# Patient Record
Sex: Female | Born: 1985 | Race: White | Hispanic: Yes | Marital: Married | Smoking: Never smoker
Health system: Southern US, Community
[De-identification: ages and names within clinical notes are randomized; demographics above are authoritative.]

## PROBLEM LIST (undated history)

## (undated) DIAGNOSIS — A630 Anogenital (venereal) warts: Secondary | ICD-10-CM

## (undated) DIAGNOSIS — R519 Headache, unspecified: Secondary | ICD-10-CM

## (undated) DIAGNOSIS — N83209 Unspecified ovarian cyst, unspecified side: Secondary | ICD-10-CM

## (undated) DIAGNOSIS — D219 Benign neoplasm of connective and other soft tissue, unspecified: Secondary | ICD-10-CM

## (undated) DIAGNOSIS — N390 Urinary tract infection, site not specified: Secondary | ICD-10-CM

## (undated) HISTORY — DX: Anogenital (venereal) warts: A63.0

## (undated) HISTORY — DX: Unspecified ovarian cyst, unspecified side: N83.209

## (undated) HISTORY — PX: NO PAST SURGERIES: SHX2092

---

## 2017-09-04 DIAGNOSIS — G43909 Migraine, unspecified, not intractable, without status migrainosus: Secondary | ICD-10-CM | POA: Insufficient documentation

## 2018-01-25 DIAGNOSIS — R7989 Other specified abnormal findings of blood chemistry: Secondary | ICD-10-CM | POA: Insufficient documentation

## 2019-07-29 DIAGNOSIS — A630 Anogenital (venereal) warts: Secondary | ICD-10-CM | POA: Insufficient documentation

## 2019-08-05 LAB — OB RESULTS CONSOLE GC/CHLAMYDIA
Chlamydia: NEGATIVE
Gonorrhea: NEGATIVE

## 2019-08-05 LAB — OB RESULTS CONSOLE ABO/RH: RH Type: POSITIVE

## 2019-08-05 LAB — OB RESULTS CONSOLE RUBELLA ANTIBODY, IGM: Rubella: IMMUNE

## 2019-08-05 LAB — OB RESULTS CONSOLE HIV ANTIBODY (ROUTINE TESTING): HIV: NONREACTIVE

## 2019-08-05 LAB — OB RESULTS CONSOLE HEPATITIS B SURFACE ANTIGEN: Hepatitis B Surface Ag: NEGATIVE

## 2019-08-05 LAB — OB RESULTS CONSOLE RPR: RPR: NONREACTIVE

## 2019-08-05 LAB — OB RESULTS CONSOLE ANTIBODY SCREEN: Antibody Screen: NEGATIVE

## 2019-08-10 ENCOUNTER — Encounter (HOSPITAL_COMMUNITY): Payer: Self-pay

## 2019-08-10 ENCOUNTER — Inpatient Hospital Stay (HOSPITAL_COMMUNITY)
Admission: EM | Admit: 2019-08-10 | Discharge: 2019-08-10 | Disposition: A | Discharge status: Home / Self Care | Payer: BC Managed Care – PPO | Attending: Obstetrics and Gynecology | Admitting: Obstetrics and Gynecology

## 2019-08-10 ENCOUNTER — Other Ambulatory Visit: Payer: Self-pay

## 2019-08-10 DIAGNOSIS — Z3A08 8 weeks gestation of pregnancy: Secondary | ICD-10-CM | POA: Diagnosis not present

## 2019-08-10 DIAGNOSIS — K219 Gastro-esophageal reflux disease without esophagitis: Secondary | ICD-10-CM

## 2019-08-10 DIAGNOSIS — O99281 Endocrine, nutritional and metabolic diseases complicating pregnancy, first trimester: Secondary | ICD-10-CM | POA: Diagnosis not present

## 2019-08-10 DIAGNOSIS — O219 Vomiting of pregnancy, unspecified: Secondary | ICD-10-CM | POA: Insufficient documentation

## 2019-08-10 DIAGNOSIS — E860 Dehydration: Secondary | ICD-10-CM | POA: Insufficient documentation

## 2019-08-10 HISTORY — DX: Headache, unspecified: R51.9

## 2019-08-10 LAB — URINALYSIS, ROUTINE W REFLEX MICROSCOPIC
Bilirubin Urine: NEGATIVE
Glucose, UA: NEGATIVE mg/dL
Hgb urine dipstick: NEGATIVE
Ketones, ur: 20 mg/dL — AB
Leukocytes,Ua: NEGATIVE
Nitrite: NEGATIVE
Protein, ur: NEGATIVE mg/dL
Specific Gravity, Urine: 1.017 (ref 1.005–1.030)
pH: 7 (ref 5.0–8.0)

## 2019-08-10 MED ORDER — LACTATED RINGERS IV BOLUS
1000.0000 mL | Freq: Once | INTRAVENOUS | Status: AC
  Administered 2019-08-10: 1000 mL via INTRAVENOUS

## 2019-08-10 MED ORDER — METOCLOPRAMIDE HCL 10 MG PO TABS: 10.0000 mg | ORAL_TABLET | Freq: Three times a day (TID) | ORAL | 1 refills | Status: DC

## 2019-08-10 MED ORDER — FAMOTIDINE 20 MG PO TABS: 20.0000 mg | ORAL_TABLET | Freq: Two times a day (BID) | ORAL | 1 refills | Status: DC

## 2019-08-10 MED ORDER — SODIUM CHLORIDE 0.9 % IV SOLN
25.0000 mg | Freq: Once | INTRAVENOUS | Status: AC
  Administered 2019-08-10: 25 mg via INTRAVENOUS
  Filled 2019-08-10: qty 1

## 2019-08-10 MED ORDER — FAMOTIDINE IN NACL 20-0.9 MG/50ML-% IV SOLN
20.0000 mg | Freq: Once | INTRAVENOUS | Status: AC
  Administered 2019-08-10: 20 mg via INTRAVENOUS
  Filled 2019-08-10: qty 50

## 2019-08-10 NOTE — Discharge Instructions (Signed)

## 2019-08-10 NOTE — MAU Note (Addendum)
States she has been having ongoing N/V.  Was prescribed diclegis without relief so they told her to take unisom/vitamin b6 but only takes it at night because it makes her tired.  She feels like she's vomiting every 2 hours.  Today started having cramps and feeling weak.  No VB.  LMP 06/12/2019.  States she had an u/s in the office at 7 weeks and had an IUP w/ a heartbeat.

## 2019-08-10 NOTE — MAU Note (Signed)
Patient provided evidence of her patient portal results for pregnancy verification and ultrasound video sent directly from the [facility] office.  Episode recorded.

## 2019-08-10 NOTE — MAU Provider Note (Addendum)
History     CSN: [id number]  Arrival date and time: 08/10/19 [id number]   First Provider Initiated Contact with Patient 08/10/19 2015      Chief Complaint  Patient presents with  . Nausea  . Emesis   HPI   Ms.Destiny Keith is a 33 y.o. female G1P0 @ [redacted]w[redacted]d here in MAU with complaints of nausea and vomiting. Says she has been taking diclegis at night and B6 during the day. She works as a Pharmacist, hospital and cannot take anything during the day that would make her sleepy. Today she vomited 5x. States she has lost 2-3 lbs. She tried eating some soup and vomited. She feels burning at the top of her abdomen. No vaginal bleeding. Patient had [place] in the office that showed fetus with heartbeat.   OB History    Gravida  1   Para      Term      Preterm      AB      Living        SAB      TAB      Ectopic      Multiple      Live Births              Past Medical History:  Diagnosis Date  . Headache     Past Surgical History:  Procedure Laterality Date  . NO PAST SURGERIES      No family history on file.  Social History   Tobacco Use  . Smoking status: Not on file  Substance Use Topics  . Alcohol use: Not on file  . Drug use: Not on file    Allergies: No Known Allergies  Medications Prior to Admission  Medication Sig Dispense Refill Last Dose  . doxylamine, Sleep, (UNISOM) 25 MG tablet Take 25 mg by mouth at bedtime as needed.   08/10/2019 at Unknown time  . Prenatal Vit-Fe Fumarate-FA (PRENATAL MULTIVITAMIN) TABS tablet Take 1 tablet by mouth daily at 12 noon.   08/10/2019 at Unknown time  . pyridOXINE (VITAMIN B-6) 50 MG tablet Take 50 mg by mouth daily.   08/10/2019 at Unknown time   Results for orders placed or performed during the hospital encounter of 08/10/19 (from the past 48 hour(s))  Urinalysis, Routine w reflex microscopic     Status: Abnormal   Collection Time: 08/10/19  7:43 PM  Result Value Ref Range   Color, Urine YELLOW YELLOW   APPearance CLOUDY (A)  CLEAR   Specific Gravity, Urine 1.017 1.005 - 1.030   pH 7.0 5.0 - 8.0   Glucose, UA NEGATIVE NEGATIVE mg/dL   Hgb urine dipstick NEGATIVE NEGATIVE   Bilirubin Urine NEGATIVE NEGATIVE   Ketones, ur 20 (A) NEGATIVE mg/dL   Protein, ur NEGATIVE NEGATIVE mg/dL   Nitrite NEGATIVE NEGATIVE   Leukocytes,Ua NEGATIVE NEGATIVE    Comment: Performed at [facility] [address], [place], [place] [address]   Review of Systems  Constitutional: Negative for fever.  Gastrointestinal: Positive for abdominal pain (Upper abdominal pain ), nausea and vomiting. Negative for diarrhea.  Neurological: Negative for dizziness.   Physical Exam   Blood pressure 118/73, pulse 78, temperature 99.4 F (37.4 C), resp. rate 19, weight 67.8 kg, last menstrual period 06/12/2019.  Physical Exam  Constitutional: She is oriented to person, place, and time. She appears well-developed and well-nourished. No distress.  HENT:  Head: Normocephalic.  GI: Soft. Normal appearance. There is abdominal tenderness in the epigastric  area. There is no rigidity, no rebound and no guarding.  Musculoskeletal: Normal range of motion.  Neurological: She is alert and oriented to person, place, and time.  Skin: Skin is warm. She is not diaphoretic.  Psychiatric: Her behavior is normal.   MAU Course  Procedures  None  MDM  Urine shows 20 ketones LR bolus x 1 Pepcid 20 mg X 1 Phenergan 25 mg IV  Patient still receiving Iv phenergan. Hansel Feinstein CNM to take over care and DC patient.  Rasch, Artist Pais, NP   Assessment and Plan  A:  Single IUP at [redacted]w[redacted]d      Nausea and vomiting      Dehydration  IV infusion completed Patient states she feel so much better Wants to go home and sleep Encouraged to return here or to other Urgent Care/ED if she develops worsening of symptoms, increase in pain, fever, or other concerning symptoms.    Seabron Spates, CNM

## 2019-11-18 ENCOUNTER — Ambulatory Visit: Payer: BC Managed Care – PPO | Attending: Internal Medicine

## 2019-11-18 DIAGNOSIS — Z20822 Contact with and (suspected) exposure to covid-19: Secondary | ICD-10-CM

## 2019-11-19 LAB — NOVEL CORONAVIRUS, NAA: SARS-CoV-2, NAA: NOT DETECTED

## 2019-12-30 DIAGNOSIS — Z23 Encounter for immunization: Secondary | ICD-10-CM | POA: Diagnosis not present

## 2019-12-30 DIAGNOSIS — Z3480 Encounter for supervision of other normal pregnancy, unspecified trimester: Secondary | ICD-10-CM | POA: Diagnosis not present

## 2020-01-06 DIAGNOSIS — O99810 Abnormal glucose complicating pregnancy: Secondary | ICD-10-CM | POA: Diagnosis not present

## 2020-01-14 DIAGNOSIS — N760 Acute vaginitis: Secondary | ICD-10-CM | POA: Diagnosis not present

## 2020-01-20 DIAGNOSIS — A630 Anogenital (venereal) warts: Secondary | ICD-10-CM | POA: Diagnosis not present

## 2020-02-11 DIAGNOSIS — R300 Dysuria: Secondary | ICD-10-CM | POA: Diagnosis not present

## 2020-02-11 DIAGNOSIS — N760 Acute vaginitis: Secondary | ICD-10-CM | POA: Diagnosis not present

## 2020-02-22 DIAGNOSIS — Z3685 Encounter for antenatal screening for Streptococcus B: Secondary | ICD-10-CM | POA: Diagnosis not present

## 2020-02-22 DIAGNOSIS — Z3480 Encounter for supervision of other normal pregnancy, unspecified trimester: Secondary | ICD-10-CM | POA: Diagnosis not present

## 2020-02-22 LAB — OB RESULTS CONSOLE GBS: GBS: NEGATIVE

## 2020-03-01 DIAGNOSIS — Z3A37 37 weeks gestation of pregnancy: Secondary | ICD-10-CM | POA: Diagnosis not present

## 2020-03-01 DIAGNOSIS — O368130 Decreased fetal movements, third trimester, not applicable or unspecified: Secondary | ICD-10-CM | POA: Diagnosis not present

## 2020-03-02 ENCOUNTER — Encounter (HOSPITAL_COMMUNITY): Payer: Self-pay | Admitting: *Deleted

## 2020-03-02 ENCOUNTER — Telehealth (HOSPITAL_COMMUNITY): Payer: Self-pay | Admitting: *Deleted

## 2020-03-02 NOTE — Telephone Encounter (Signed)
Preadmission screen  

## 2020-03-03 ENCOUNTER — Encounter (HOSPITAL_COMMUNITY): Payer: Self-pay | Admitting: *Deleted

## 2020-03-09 ENCOUNTER — Other Ambulatory Visit (HOSPITAL_COMMUNITY)
Admission: RE | Admit: 2020-03-09 | Discharge: 2020-03-09 | Disposition: A | Discharge status: Home / Self Care | Payer: BC Managed Care – PPO | Source: Ambulatory Visit | Attending: Obstetrics and Gynecology | Admitting: Obstetrics and Gynecology

## 2020-03-09 DIAGNOSIS — Z3A39 39 weeks gestation of pregnancy: Secondary | ICD-10-CM | POA: Diagnosis not present

## 2020-03-09 DIAGNOSIS — O99891 Other specified diseases and conditions complicating pregnancy: Secondary | ICD-10-CM | POA: Diagnosis not present

## 2020-03-09 DIAGNOSIS — O26893 Other specified pregnancy related conditions, third trimester: Secondary | ICD-10-CM | POA: Diagnosis not present

## 2020-03-09 DIAGNOSIS — Z01812 Encounter for preprocedural laboratory examination: Secondary | ICD-10-CM | POA: Insufficient documentation

## 2020-03-09 DIAGNOSIS — Z20822 Contact with and (suspected) exposure to covid-19: Secondary | ICD-10-CM | POA: Diagnosis not present

## 2020-03-09 LAB — SARS CORONAVIRUS 2 (TAT 6-24 HRS): SARS Coronavirus 2: NEGATIVE

## 2020-03-09 NOTE — H&P (Signed)
Destiny Keith is a 34 y.o. female presenting for two stage IOL. 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  OB History    Gravida  3   Para      Term      Preterm      AB  2   Living        SAB  1   TAB  1   Ectopic      Multiple      Live Births             Past Medical History:  Diagnosis Date  . Genital warts   . Headache   . Ovarian cyst    Past Surgical History:  Procedure Laterality Date  . NO PAST SURGERIES     Family History: family history includes Diabetes in her maternal grandmother; Hypertension in her maternal grandmother; Thyroid disease in her maternal grandmother. Social History:  reports that she has never smoked. She has never used smokeless tobacco. She reports previous alcohol use. She reports that she does not use drugs.     Maternal Diabetes: No Genetic Screening: Normal Maternal Ultrasounds/Referrals: Normal Fetal Ultrasounds or other Referrals:  None Maternal Substance Abuse:  No Significant Maternal Medications:  None Significant Maternal Lab Results:  Group B Strep negative Other Comments:  None  Review of Systems  Eyes: Negative for visual disturbance.  Gastrointestinal: Negative for abdominal pain.  Neurological: Negative for headaches.   Maternal Medical History:  Fetal activity: Perceived fetal activity is normal.        Last menstrual period 06/12/2019. Maternal Exam:  Abdomen: Fetal presentation: vertex     Physical Exam  Cardiovascular: Normal rate.  Respiratory: Effort normal.  GI: Soft. There is no abdominal tenderness.    Prenatal labs: ABO, Rh: O/Positive/-- (11/18 0000) Antibody: Negative (11/18 0000) Rubella: Immune (11/18 0000) RPR: Nonreactive (11/18 0000)  HBsAg: Negative (11/18 0000)  HIV: Non-reactive (11/18 0000)  GBS: Negative/-- (06/07 0000)   Assessment/Plan: 34 yo G3P0 at term for 2 stage IOL   Destiny Keith 03/09/2020, 6:12 PM

## 2020-03-10 ENCOUNTER — Other Ambulatory Visit: Payer: Self-pay

## 2020-03-10 DIAGNOSIS — O99891 Other specified diseases and conditions complicating pregnancy: Secondary | ICD-10-CM | POA: Diagnosis not present

## 2020-03-11 ENCOUNTER — Encounter (HOSPITAL_COMMUNITY): Payer: Self-pay | Admitting: Obstetrics and Gynecology

## 2020-03-11 ENCOUNTER — Inpatient Hospital Stay (HOSPITAL_COMMUNITY): Payer: BC Managed Care – PPO

## 2020-03-11 ENCOUNTER — Inpatient Hospital Stay (HOSPITAL_COMMUNITY): Payer: BC Managed Care – PPO | Admitting: Anesthesiology

## 2020-03-11 ENCOUNTER — Inpatient Hospital Stay (HOSPITAL_COMMUNITY)
Admission: RE | Admit: 2020-03-11 | Discharge: 2020-03-13 | DRG: 807 | Disposition: A | Discharge status: Home / Self Care | Payer: BC Managed Care – PPO | Attending: Obstetrics and Gynecology | Admitting: Obstetrics and Gynecology

## 2020-03-11 DIAGNOSIS — Z3490 Encounter for supervision of normal pregnancy, unspecified, unspecified trimester: Secondary | ICD-10-CM

## 2020-03-11 DIAGNOSIS — O26893 Other specified pregnancy related conditions, third trimester: Secondary | ICD-10-CM | POA: Diagnosis present

## 2020-03-11 DIAGNOSIS — Z3A39 39 weeks gestation of pregnancy: Secondary | ICD-10-CM

## 2020-03-11 DIAGNOSIS — Z20822 Contact with and (suspected) exposure to covid-19: Secondary | ICD-10-CM | POA: Diagnosis present

## 2020-03-11 LAB — CBC
HCT: 36.2 % (ref 36.0–46.0)
Hemoglobin: 12.1 g/dL (ref 12.0–15.0)
MCH: 30.6 pg (ref 26.0–34.0)
MCHC: 33.4 g/dL (ref 30.0–36.0)
MCV: 91.6 fL (ref 80.0–100.0)
Platelets: 224 10*3/uL (ref 150–400)
RBC: 3.95 MIL/uL (ref 3.87–5.11)
RDW: 12.8 % (ref 11.5–15.5)
WBC: 7.3 10*3/uL (ref 4.0–10.5)
nRBC: 0 % (ref 0.0–0.2)

## 2020-03-11 LAB — TYPE AND SCREEN
ABO/RH(D): O POS
Antibody Screen: NEGATIVE

## 2020-03-11 LAB — RPR: RPR Ser Ql: NONREACTIVE

## 2020-03-11 LAB — ABO/RH: ABO/RH(D): O POS

## 2020-03-11 MED ORDER — TERBUTALINE SULFATE 1 MG/ML IJ SOLN: 0.2500 mg | Freq: Once | INTRAMUSCULAR | Status: DC | PRN

## 2020-03-11 MED ORDER — ONDANSETRON HCL 4 MG/2ML IJ SOLN: 4.0000 mg | Freq: Four times a day (QID) | INTRAMUSCULAR | Status: DC | PRN

## 2020-03-11 MED ORDER — LACTATED RINGERS IV SOLN: INTRAVENOUS | Status: DC

## 2020-03-11 MED ORDER — LACTATED RINGERS IV SOLN: 500.0000 mL | Freq: Once | INTRAVENOUS | Status: DC

## 2020-03-11 MED ORDER — FENTANYL CITRATE (PF) 100 MCG/2ML IJ SOLN
50.0000 ug | INTRAMUSCULAR | Status: DC | PRN
  Administered 2020-03-11 (×2): 100 ug via INTRAVENOUS
  Filled 2020-03-11 (×2): qty 2

## 2020-03-11 MED ORDER — SOD CITRATE-CITRIC ACID 500-334 MG/5ML PO SOLN: 30.0000 mL | ORAL | Status: DC | PRN

## 2020-03-11 MED ORDER — FENTANYL-BUPIVACAINE-NACL 0.5-0.125-0.9 MG/250ML-% EP SOLN
12.0000 mL/h | EPIDURAL | Status: DC | PRN
  Filled 2020-03-11: qty 250

## 2020-03-11 MED ORDER — BENZOCAINE-MENTHOL 20-0.5 % EX AERO
1.0000 "application " | INHALATION_SPRAY | CUTANEOUS | Status: DC | PRN
  Administered 2020-03-12: 1 via TOPICAL
  Filled 2020-03-11: qty 56

## 2020-03-11 MED ORDER — HYDROXYZINE HCL 50 MG PO TABS: 50.0000 mg | ORAL_TABLET | Freq: Four times a day (QID) | ORAL | Status: DC | PRN

## 2020-03-11 MED ORDER — EPHEDRINE 5 MG/ML INJ: 10.0000 mg | INTRAVENOUS | Status: DC | PRN

## 2020-03-11 MED ORDER — PHENYLEPHRINE 40 MCG/ML (10ML) SYRINGE FOR IV PUSH (FOR BLOOD PRESSURE SUPPORT)
80.0000 ug | PREFILLED_SYRINGE | INTRAVENOUS | Status: DC | PRN
  Administered 2020-03-11: 80 ug via INTRAVENOUS

## 2020-03-11 MED ORDER — WITCH HAZEL-GLYCERIN EX PADS: 1.0000 "application " | MEDICATED_PAD | CUTANEOUS | Status: DC | PRN

## 2020-03-11 MED ORDER — OXYTOCIN-SODIUM CHLORIDE 30-0.9 UT/500ML-% IV SOLN
2.5000 [IU]/h | INTRAVENOUS | Status: DC
  Filled 2020-03-11: qty 500

## 2020-03-11 MED ORDER — TETANUS-DIPHTH-ACELL PERTUSSIS 5-2.5-18.5 LF-MCG/0.5 IM SUSP: 0.5000 mL | Freq: Once | INTRAMUSCULAR | Status: DC

## 2020-03-11 MED ORDER — LIDOCAINE HCL (PF) 1 % IJ SOLN
INTRAMUSCULAR | Status: DC | PRN
  Administered 2020-03-11: 11 mL via EPIDURAL

## 2020-03-11 MED ORDER — ZOLPIDEM TARTRATE 5 MG PO TABS: 5.0000 mg | ORAL_TABLET | Freq: Every evening | ORAL | Status: DC | PRN

## 2020-03-11 MED ORDER — MISOPROSTOL 25 MCG QUARTER TABLET
25.0000 ug | ORAL_TABLET | ORAL | Status: DC | PRN
  Administered 2020-03-11 (×3): 25 ug via VAGINAL
  Filled 2020-03-11 (×3): qty 1

## 2020-03-11 MED ORDER — OXYTOCIN-SODIUM CHLORIDE 30-0.9 UT/500ML-% IV SOLN
1.0000 m[IU]/min | INTRAVENOUS | Status: DC
  Administered 2020-03-11: 2 m[IU]/min via INTRAVENOUS

## 2020-03-11 MED ORDER — COCONUT OIL OIL: 1.0000 "application " | TOPICAL_OIL | Status: DC | PRN

## 2020-03-11 MED ORDER — LIDOCAINE HCL (PF) 1 % IJ SOLN
30.0000 mL | INTRAMUSCULAR | Status: AC | PRN
  Administered 2020-03-11: 30 mL via SUBCUTANEOUS
  Filled 2020-03-11: qty 30

## 2020-03-11 MED ORDER — PHENYLEPHRINE 40 MCG/ML (10ML) SYRINGE FOR IV PUSH (FOR BLOOD PRESSURE SUPPORT)
80.0000 ug | PREFILLED_SYRINGE | INTRAVENOUS | Status: DC | PRN
  Filled 2020-03-11: qty 10

## 2020-03-11 MED ORDER — SENNOSIDES-DOCUSATE SODIUM 8.6-50 MG PO TABS
2.0000 | ORAL_TABLET | ORAL | Status: DC
  Administered 2020-03-12 (×2): 2 via ORAL
  Filled 2020-03-11 (×2): qty 2

## 2020-03-11 MED ORDER — ONDANSETRON HCL 4 MG/2ML IJ SOLN: 4.0000 mg | INTRAMUSCULAR | Status: DC | PRN

## 2020-03-11 MED ORDER — OXYCODONE HCL 5 MG PO TABS: 5.0000 mg | ORAL_TABLET | ORAL | Status: DC | PRN

## 2020-03-11 MED ORDER — SIMETHICONE 80 MG PO CHEW: 80.0000 mg | CHEWABLE_TABLET | ORAL | Status: DC | PRN

## 2020-03-11 MED ORDER — IBUPROFEN 600 MG PO TABS
600.0000 mg | ORAL_TABLET | Freq: Four times a day (QID) | ORAL | Status: DC
  Administered 2020-03-11 – 2020-03-13 (×8): 600 mg via ORAL
  Filled 2020-03-11 (×8): qty 1

## 2020-03-11 MED ORDER — ONDANSETRON HCL 4 MG PO TABS: 4.0000 mg | ORAL_TABLET | ORAL | Status: DC | PRN

## 2020-03-11 MED ORDER — DIPHENHYDRAMINE HCL 25 MG PO CAPS: 25.0000 mg | ORAL_CAPSULE | Freq: Four times a day (QID) | ORAL | Status: DC | PRN

## 2020-03-11 MED ORDER — LACTATED RINGERS IV SOLN: 500.0000 mL | INTRAVENOUS | Status: DC | PRN

## 2020-03-11 MED ORDER — OXYCODONE HCL 5 MG PO TABS: 10.0000 mg | ORAL_TABLET | ORAL | Status: DC | PRN

## 2020-03-11 MED ORDER — ACETAMINOPHEN 325 MG PO TABS: 650.0000 mg | ORAL_TABLET | ORAL | Status: DC | PRN

## 2020-03-11 MED ORDER — OXYTOCIN BOLUS FROM INFUSION
333.0000 mL | Freq: Once | INTRAVENOUS | Status: AC
  Administered 2020-03-11: 333 mL via INTRAVENOUS

## 2020-03-11 MED ORDER — DIBUCAINE (PERIANAL) 1 % EX OINT: 1.0000 "application " | TOPICAL_OINTMENT | CUTANEOUS | Status: DC | PRN

## 2020-03-11 MED ORDER — SODIUM CHLORIDE (PF) 0.9 % IJ SOLN
INTRAMUSCULAR | Status: DC | PRN
  Administered 2020-03-11: 12 mL/h via EPIDURAL

## 2020-03-11 MED ORDER — PRENATAL MULTIVITAMIN CH
1.0000 | ORAL_TABLET | Freq: Every day | ORAL | Status: DC
  Administered 2020-03-12 – 2020-03-13 (×2): 1 via ORAL
  Filled 2020-03-11 (×2): qty 1

## 2020-03-11 MED ORDER — FLEET ENEMA 7-19 GM/118ML RE ENEM: 1.0000 | ENEMA | RECTAL | Status: DC | PRN

## 2020-03-11 MED ORDER — ACETAMINOPHEN 325 MG PO TABS
650.0000 mg | ORAL_TABLET | ORAL | Status: DC | PRN
  Administered 2020-03-12 – 2020-03-13 (×3): 650 mg via ORAL
  Filled 2020-03-11 (×3): qty 2

## 2020-03-11 MED ORDER — OXYCODONE-ACETAMINOPHEN 5-325 MG PO TABS: 1.0000 | ORAL_TABLET | ORAL | Status: DC | PRN

## 2020-03-11 MED ORDER — DIPHENHYDRAMINE HCL 50 MG/ML IJ SOLN: 12.5000 mg | INTRAMUSCULAR | Status: DC | PRN

## 2020-03-11 MED ORDER — OXYCODONE-ACETAMINOPHEN 5-325 MG PO TABS: 2.0000 | ORAL_TABLET | ORAL | Status: DC | PRN

## 2020-03-11 NOTE — Lactation Note (Addendum)
This note was copied from a baby's chart. Lactation Consultation Note  Patient Name: Destiny Keith [id number] Date: 03/11/2020 Reason for consult: Initial assessment;1st time breastfeeding;Term;Infant < 6lbs P1, 5 hour term SGA infant, weight 5 lbs 1.1 ounce at birth. Per mom, infant has not latched at breast has been very sleepy, she has made  two attempts. Infant had one stool since birth.  Tools given: breast shells and hand pump given due mom having inverted nipples, DEBP due infant currently not latching with 1st BF attempt and to help mom establish milk supply.  Mom understands to pump every 3 hours for 15 minutes on initial setting, to wear breast shells in bra during the day and not at night nor while she is sleeping and to pre-pump breast prior to latching infant at the breast. Mom will discussed with dad  to offer infant  donor breast milk as a  supplemented if infant continues not to latch at breast,due to  infant's small size  and being  sleepy.  Infant will be re-assessed by RN tonight  for upcoming feedings regarding donor breast milk   and help mom with latching infant at breast. Albers discussed  Mom's plan with RN. Mom was taught hand expression and infant was given 3 mls of EBM by spoon to encourage infant to suckle, LC did suck training with infant on gloved finger, infant suckle few minutes and then stop, but became more alert after receiving EBM. Mom pre-pumped breast and attempted to latch infant on her right breast using the foot ball hold, infant suckle only once then stopped , holding breast in mouth but not eliciting the suck and swallow response.  Infant was given additional 5 mls of colostrum by spoon, total intake of 8 mls for feeding. Mom was using the DEBP as [place] left room. Mom shown how to use DEBP & how to disassemble, clean, & reassemble parts. Mom will work with latching infant before attempting to use NS at this time, this will be reassess if nipple shield is needed due  to mom having inverted nipple and if infant continues not to latch at the breast.  Mom knows to breastfeed infant by hunger cues, 8 to 12 times within 24 hours, on demand and not exceed 3 hours without feeding infant. Mom knows to ask RN or LC for assistance with latching infant at breast if needed. Reviewed Baby & Me book's Breastfeeding Basics.  Mom made aware of O/P services, breastfeeding support groups, community resources, and our phone # for post-discharge questions.  Maternal Data Formula Feeding for Exclusion: No Has patient been taught Hand Expression?: Yes Does the patient have breastfeeding experience prior to this delivery?: No  Feeding Feeding Type: Breast Fed  LATCH Score Latch: Too sleepy or reluctant, no latch achieved, no sucking elicited.  Audible Swallowing: None  Type of Nipple: Inverted  Comfort (Breast/Nipple): Soft / non-tender  Hold (Positioning): Assistance needed to correctly position infant at breast and maintain latch.  LATCH Score: 3  Interventions Interventions: Breast feeding basics reviewed;Breast compression;Assisted with latch;Adjust position;Hand pump;DEBP;Support pillows;Skin to skin;Breast massage;Position options;Hand express;Expressed milk;Pre-pump if needed;Shells  Lactation Tools Discussed/Used Tools: Shells;Pump Shell Type: Inverted Breast pump type: Double-Electric Breast Pump;Manual WIC Program: No Pump Review: Setup, frequency, and cleaning;Milk Storage Initiated by:: Vicente Serene, IBCLC Date initiated:: 03/12/20   Consult Status Consult Status: Follow-up Date: 03/12/20 Follow-up type: In-patient    Vicente Serene 03/11/2020, 9:43 PM

## 2020-03-11 NOTE — Anesthesia Preprocedure Evaluation (Signed)

## 2020-03-11 NOTE — Progress Notes (Signed)
FHT cat one UCs irregular Cx 1/50/-3/vtx Miso 25 mcg placed in vagina D/W patient

## 2020-03-11 NOTE — Anesthesia Procedure Notes (Signed)
Epidural Patient location during procedure: OB Start time: 03/11/2020 2:18 PM End time: 03/11/2020 2:30 PM  Staffing Anesthesiologist: Lynda Rainwater, MD Performed: anesthesiologist   Preanesthetic Checklist Completed: patient identified, IV checked, site marked, risks and benefits discussed, surgical consent, monitors and equipment checked, pre-op evaluation and timeout performed  Epidural Patient position: sitting Prep: ChloraPrep Patient monitoring: heart rate, cardiac monitor, continuous pulse ox and blood pressure Approach: midline Location: L2-L3 Injection technique: LOR saline  Needle:  Needle type: Tuohy  Needle gauge: 17 G Needle length: 9 cm Needle insertion depth: 6 cm Catheter type: closed end flexible Catheter size: 20 Guage Catheter at skin depth: 10 cm Test dose: negative  Assessment Events: blood not aspirated, injection not painful, no injection resistance, no paresthesia and negative IV test  Additional Notes Reason for block:procedure for pain

## 2020-03-11 NOTE — Progress Notes (Signed)
UCs irregular, mild/mod FHT cat one Cytotec about 5 am D/W patient IOL. Reassess cx st 9am

## 2020-03-11 NOTE — Progress Notes (Signed)
Operative Delivery Note At 4:38 PM a viable female was delivered via Vaginal, Vacuum Neurosurgeon).  Presentation: vertex; Position: Left,, Occiput,, Anterior; Station: +4.  Verbal consent: obtained from patient.  Risks and benefits discussed in detail.  Risks include, but are not limited to the risks of anesthesia, bleeding, infection, damage to maternal tissues, fetal cephalhematoma.  There is also the risk of inability to effect vaginal delivery of the head, or shoulder dystocia that cannot be resolved by established maneuvers, leading to the need for emergency cesarean section.  Pushing well. Crowning, unable to push baby around pubis. She requests help. D/W VE. Kiwi-1 gently pull, no popoffs.  APGAR: 9, 9; weight  .   Placenta status: , .   Cord:  with the following complications: .  Cord pH: pending  Anesthesia:   Instruments: Kiwi Episiotomy: None Lacerations: Bilateral periurethral lacs repaired. Second degree ML lac repaired.  Suture Repair: 2.0 vicryl rapide Est. Blood Loss (mL):    Mom to postpartum.  Baby to Couplet care / Skin to Skin.  Shon Millet II 03/11/2020, 4:57 PM

## 2020-03-12 ENCOUNTER — Encounter (HOSPITAL_COMMUNITY): Payer: Self-pay | Admitting: Anesthesiology

## 2020-03-12 LAB — CBC
HCT: 32.7 % — ABNORMAL LOW (ref 36.0–46.0)
Hemoglobin: 11 g/dL — ABNORMAL LOW (ref 12.0–15.0)
MCH: 30.8 pg (ref 26.0–34.0)
MCHC: 33.6 g/dL (ref 30.0–36.0)
MCV: 91.6 fL (ref 80.0–100.0)
Platelets: 206 10*3/uL (ref 150–400)
RBC: 3.57 MIL/uL — ABNORMAL LOW (ref 3.87–5.11)
RDW: 13 % (ref 11.5–15.5)
WBC: 9.3 10*3/uL (ref 4.0–10.5)
nRBC: 0 % (ref 0.0–0.2)

## 2020-03-12 NOTE — Progress Notes (Signed)
Post Partum Day 1 Subjective: no complaints, up ad lib, voiding, tolerating PO and + flatus  Objective: Blood pressure 120/64, pulse 73, temperature 98 F (36.7 C), temperature source Oral, resp. rate 18, height 5\' 2"  (1.575 m), weight 86 kg, last menstrual period 06/12/2019, SpO2 100 %, unknown if currently breastfeeding.  Physical Exam:  General: alert, cooperative and no distress Lochia: appropriate Uterine Fundus: firm Incision: healing well DVT Evaluation: No evidence of DVT seen on physical exam.  Recent Labs    03/11/20 0047 03/12/20 0450  HGB 12.1 11.0*  HCT 36.2 32.7*    Assessment/Plan: Plan for discharge tomorrow D/W circumcision of boy baby Risks reviewed. She states she understands and agrees  LOS: 1 day   Destiny Keith 03/12/2020, 6:27 AM

## 2020-03-12 NOTE — Anesthesia Postprocedure Evaluation (Signed)
Anesthesia Post Note  Patient: Destiny Keith  Procedure(s) Performed: AN AD [place]     Patient location during evaluation: Mother Baby Anesthesia Type: Epidural Level of consciousness: awake and alert, oriented and patient cooperative Pain management: pain level controlled Vital Signs Assessment: post-procedure vital signs reviewed and stable Respiratory status: spontaneous breathing Cardiovascular status: stable Postop Assessment: no headache, epidural receding, patient able to bend at knees and no signs of nausea or vomiting Anesthetic complications: no Comments: Pt seen and evaluated by Riley Nearing, CRNA.   No complications documented.  Last Vitals:  Vitals:   03/12/20 0400 03/12/20 0812  BP: 120/64 128/86  Pulse: 73 96  Resp:  18  Temp: 36.7 C 36.7 C  SpO2: 100% 100%    Last Pain:  Vitals:   03/12/20 0813  TempSrc:   PainSc: 2    Pain Goal:                Epidural/Spinal Function Cutaneous sensation: Normal sensation (03/12/20 0813), Patient able to flex knees: Yes (03/12/20 0813), Patient able to lift hips off bed: Yes (03/12/20 0813), Back pain beyond tenderness at insertion site: No (03/12/20 0813), Progressively worsening motor and/or sensory loss: No (03/12/20 0813), Bowel and/or bladder incontinence post epidural: No (03/12/20 0813)  Rico Sheehan

## 2020-03-13 ENCOUNTER — Ambulatory Visit: Payer: Self-pay

## 2020-03-13 MED ORDER — IBUPROFEN 600 MG PO TABS: 600.0000 mg | ORAL_TABLET | Freq: Four times a day (QID) | ORAL | 0 refills | Status: DC | PRN

## 2020-03-13 NOTE — Discharge Summary (Signed)
Postpartum Discharge Summary  Date of Service updated6/27/21     Patient Name: Destiny Keith DOB: April 18, 1986 MRN: [id number]  Date of admission: 03/11/2020 Delivery date:03/11/2020  Delivering provider: Everlene Farrier  Date of discharge: 03/13/2020  Admitting diagnosis: Term pregnancy [Z34.90] Intrauterine pregnancy: [redacted]w[redacted]d    Secondary diagnosis:  Active Problems:   Term pregnancy  Additional problems: none    Discharge diagnosis: Term Pregnancy Delivered                                              Post partum procedures:none Augmentation: AROM, Pitocin and Cytotec Complications: None  Hospital course: Induction of Labor With Vaginal Delivery   34y.o. yo [id number] admitted to the hospital 03/11/2020 for induction of labor.  Indication for induction: Elective.  Patient had an uncomplicated labor course as follows: Membrane Rupture Time/Date: 1:35 PM ,03/11/2020   Delivery Method:Vaginal, Vacuum (Extractor)  Episiotomy: None  Lacerations:  2nd degree;Periurethral  Details of delivery can be found in separate delivery note.  Patient had a routine postpartum course. Patient is discharged home 03/13/20.  Newborn Data: Birth date:03/11/2020  Birth time:4:38 PM  Gender:Female  Living status:Living  Apgars:9 ,9  Weight:2299 g   Magnesium Sulfate received: No BMZ received: No Rhophylac:No MMR:No T-DaP:Given prenatally Flu: No Transfusion:No  Physical exam  Vitals:   03/12/20 0812 03/12/20 1428 03/12/20 1930 03/13/20 0520  BP: 128/86 128/86 119/84 126/81  Pulse: 96 69 78 70  Resp: 18 16 18 18   Temp: 98 F (36.7 C) 98.8 F (37.1 C) 98.4 F (36.9 C) 97.6 F (36.4 C)  TempSrc: Oral Oral Oral Oral  SpO2: 100% 98% 100% 100%  Weight:      Height:       General: alert, cooperative and no distress Lochia: appropriate Uterine Fundus: firm Incision: Healing well with no significant drainage DVT Evaluation: No evidence of DVT seen on physical exam. Labs: Lab  Results  Component Value Date   WBC 9.3 03/12/2020   HGB 11.0 (L) 03/12/2020   HCT 32.7 (L) 03/12/2020   MCV 91.6 03/12/2020   PLT 206 03/12/2020   No flowsheet data found. Edinburgh Score: Edinburgh Postnatal Depression Scale Screening Tool 03/11/2020  I have been able to laugh and see the funny side of things. 0  I have looked forward with enjoyment to things. 0  I have blamed myself unnecessarily when things went wrong. 1  I have been anxious or worried for no good reason. 3  I have felt scared or panicky for no good reason. 2  Things have been getting on top of me. 1  I have been so unhappy that I have had difficulty sleeping. 1  I have felt sad or miserable. 0  I have been so unhappy that I have been crying. 1  The thought of harming myself has occurred to me. 0  Edinburgh Postnatal Depression Scale Total 9      After visit meds:  Allergies as of 03/13/2020   No Known Allergies     Medication List    STOP taking these medications   doxylamine (Sleep) 25 MG tablet Commonly known as: UNISOM   metoCLOPramide 10 MG tablet Commonly known as: REGLAN     TAKE these medications   acetaminophen 325 MG tablet Commonly known as: TYLENOL Take 650 mg by mouth every  6 (six) hours as needed for mild pain.   famotidine 20 MG tablet Commonly known as: PEPCID Take 1 tablet (20 mg total) by mouth 2 (two) times daily.   ibuprofen 600 MG tablet Commonly known as: ADVIL Take 1 tablet (600 mg total) by mouth every 6 (six) hours as needed.   prenatal multivitamin Tabs tablet Take 1 tablet by mouth daily at 12 noon.        Discharge home in stable condition Infant Feeding: Breast Infant Disposition:home with mother Discharge instruction: per After Visit Summary and Postpartum booklet. Activity: Advance as tolerated. Pelvic rest for 6 weeks.  Diet: routine diet Anticipated Birth Control: Unsure Postpartum Appointment:6 weeks Additional Postpartum F/U:  Future  Appointments:No future appointments. Follow up Visit:      03/13/2020 Allena Katz, MD

## 2020-03-13 NOTE — Lactation Note (Signed)
This note was copied from a baby's chart. Lactation Consultation Note  Patient Name: Destiny Keith EFEOF'H Date: 03/13/2020  Mom is a G1P1. Vag delivery of baby Destiny Olen Cordial now 36 hours old.  Mom reports he has still not latched.  Mom reports she knows she she needs to pump more often but has only pumped three times.  Mom reports she is unable to get anything out with hand expression or pumping. Discussed importance with mom of pumping at least every 3 hours past breastfeeds and/or attempted breastfeeds. Mom reports he falls asleep very easily when taking the bottle.  Infant recently fed 20 ml.  Asked mom if we could attempt to breastfeed with Olen Cordial.  Mom in agreement.    Assisted in unswaddling him and taking his gloves off.  Demo hand expression with mom. LC able to get drops of what looks like transitional milk.   Mom attempted.  Could not get anything out.  LC put hand over moms hand, still unable to get anything.  Assisted with latching infant with 20 ml nipple shield.  Infant latched on first try and breastfed well for about 10 minutes.  A few swallows heard.  Infant continuously wants to purse lips at the breast on the nipple shield.  Continuosly have to pull chin down and do chin tug. Infant fell asleep at the breast.  Showed mom how to break suction but didn't have too, infant came off.  Breastmilk in nipple shield. Mom attempted to feed it to infant on he finger. Urged mom to feed on cue and 8-12 or more times day.  Pump and hand express past breastfeedings and or attempted breastfeeding. Discussed nipple shield use.   Urged mom to follow up with lactation as needed.    Maternal Data    Feeding Feeding Type: Breast Fed Nipple Type: Nfant Extra Slow Flow (gold)  LATCH Score                   Interventions    Lactation Tools Discussed/Used     Consult Status      Annora Guderian Thompson Caul 03/13/2020, 1:04 PM

## 2020-03-14 ENCOUNTER — Ambulatory Visit: Payer: Self-pay

## 2020-03-14 NOTE — Lactation Note (Signed)
This note was copied from a baby's chart. Lactation Consultation Note  Patient Name: Destiny Keith JHERD'E Date: 03/14/2020 Reason for consult: Follow-up assessment;Term;Infant < 6lbs;Primapara;1st time breastfeeding  P1 mother whose infant is now 1 hours old.  This is a term baby weighing < 6 lbs.  Infant had a 10% weight loss this a.m.  Baby was swaddled and asleep in grandmother's arms when I arrived.  During my conversation with mother he began to slowly arouse.  Offered to assist with waking and latching baby to the breast.  Mother agreeable.  Unswaddled baby and allowed him to suck on my gloved finger; suck was strong and organized.  Mother has large, soft breasts and nipples are short shafted and intact.  She has been using a manual pump and breast shells for nipple eversion.  Asked mother to hand express.  After altering her technique she was able to easily express drops which I finger fed back to baby.  Reviewed how to correctly place NS.  Pre-filled NS tip with the 5 mls of EBM that mother had pumped.  Baby was initially unwilling to begin sucking, however, after two attempts he latched and sucked the 5 mls easily from the NS.  Allowed him to continue feeding at the breast for an additional 15 minutes.  Spoke with mother about increasing his supplementation volumes to 30 mls today.  Warmed an additional 25 mls of donor breast milk and Destiny Keith (SLP) entered the room to observe and assist with bottle feeding.  Provided helpful hints for mother and we observed a baby that was feeding well using the gold Nfant nipple.  Destiny Keith provided the light purple Nfant nipple since he seemed to be progressing nicely.  His suck was rhythmic and pacing was steady with gentle stimulation intermittently.  Mother worked well with him while [place] observed.  At the end of the 30 minutes he had consumed all the donor milk.  Mother burped and held him STS.  He was very content.  Mother will bottle feed prior to breast  feeding throughout the day per Destiny Keith's request.  He will be allowed to breast feed after bottle feeding for as long as he desires.  Mother will have the opportunity to choose which nipple he seems to prefer.  Mother will call for any questions/concerns.  Destiny Keith will return tomorrow for follow up.  Provided coconut oil for comfort for mother's breasts/nipples.  Mother has a DEBP for home use.  Grandmother present (she does not speak [place]) while father is at home.  He was here yesterday.  RN updated with feeding plan.   Maternal Data Formula Feeding for Exclusion: No Has patient been taught Hand Expression?: Yes Does the patient have breastfeeding experience prior to this delivery?: No  Feeding Feeding Type: Breast Fed Nipple Type: Nfant Extra Slow Flow (gold)  LATCH Score Latch: Repeated attempts needed to sustain latch, nipple held in mouth throughout feeding, stimulation needed to elicit sucking reflex.  Audible Swallowing: A few with stimulation  Type of Nipple: Everted at rest and after stimulation (short shafted)  Comfort (Breast/Nipple): Soft / non-tender  Hold (Positioning): Assistance needed to correctly position infant at breast and maintain latch.  LATCH Score: 7  Interventions Interventions: Breast feeding basics reviewed;Assisted with latch;Skin to skin;Breast massage;Hand express;Pre-pump if needed;Breast compression;Adjust position;DEBP;Hand pump;Shells;Coconut oil;Expressed milk;Position options;Support pillows  Lactation Tools Discussed/Used Tools: Pump;Coconut oil;Nipple Shields Nipple shield size: 20 Shell Type: Inverted Breast pump type: Double-Electric Breast Pump;Manual   Consult Status Consult Status:  Follow-up Date: 03/15/20 Follow-up type: In-patient    Destiny Keith 03/14/2020, 1:21 PM

## 2020-03-14 NOTE — Anesthesia Postprocedure Evaluation (Signed)
Anesthesia Post Note  Patient: Destiny Keith  Procedure(s) Performed: AN AD HOC LABOR EPIDURAL     Patient location during evaluation: Other Anesthesia Type: Epidural Level of consciousness: awake Pain management: pain level controlled Vital Signs Assessment: post-procedure vital signs reviewed and stable Respiratory status: spontaneous breathing Postop Assessment: no headache and no backache Anesthetic complications: no Comments: Patient called at home. No anesthesia complications.   No complications documented.  Last Vitals:  Vitals:   03/12/20 1930 03/13/20 0520  BP: 119/84 126/81  Pulse: 78 70  Resp: 18 18  Temp: 36.9 C 36.4 C  SpO2: 100% 100%    Last Pain:  Vitals:   03/13/20 1104  TempSrc:   PainSc: 4    Pain Goal:                   Rashelle Ireland

## 2020-03-15 ENCOUNTER — Ambulatory Visit: Payer: Self-pay

## 2020-03-15 NOTE — Lactation Note (Signed)
This note was copied from a baby's chart. Lactation Consultation Note  Patient Name: Destiny Keith MVEHM'C Date: 03/15/2020 Reason for consult: Follow-up assessment;Term;Infant < 6lbs;Primapara;1st time breastfeeding;Infant weight loss  P1 mother whose infant is now 54 hours old.  This is a term baby weighing < 6 lbs.  Infant had a 10% weight loss yesterday and is now down to an 8% weight loss from birth this morning.  Baby has had some difficulty with feeding, however, he is progressing.    Parents continue to follow the feeding plan established yesterday.  Mother has been primarily bottle feeding and pumping.  Parents have been feeding on a three hour schedule and baby has been consuming approximately 30 mls every three hours.  SLP in yesterday during my visit and offered the purple nipple for parents to try instead of the gold Nfant nipple.  However, baby seems to feed better with the gold nipple.  Parents stated he starts dribbling from the sides of his mouth with the purple nipple.  I suggested they continue using the gold nipple today.    Mother's breasts are heavier and fuller.  She has been able to pump 15 mls as compared to the 5 mls/session that she was pumping yesterday.  Praised her for her continued dedication and hard work.  Both parents are exhausted but continue to work well together for the benefit of baby.  Advised to continue feeding baby at least 30 mls or more per session; suggested increasing the amounts in increments of 5-10 mls at a time so milk is not wasted.  Allow baby to consume whatever volume he desires.  Baby continues to burp well with feeds.  Provided emotional support and reminded parents that the feeding has progressed and that breast feeding takes time.  Mother is able to breast feed baby after bottle feeding as desired.  Per SLP, bottle feeding first followed by breast feeding is recommended.  Engorgement prevention/treatment discussed.  Mother is using EBM/coconut  oil for comfort.  She has a manual pump and a DEBP for home use.  Father present and ready to bottle feed baby.  Parents are anticipating a discharge today.  RN updated.   Maternal Data Formula Feeding for Exclusion: No Has patient been taught Hand Expression?: Yes Does the patient have breastfeeding experience prior to this delivery?: No  Feeding Feeding Type: Donor Breast Milk Nipple Type: Nfant Extra Slow Flow (gold)  LATCH Score                   Interventions    Lactation Tools Discussed/Used     Consult Status Consult Status: Complete Date: 03/15/20 Follow-up type: Call as needed    Destiny Keith Destiny Keith 03/15/2020, 7:47 AM

## 2020-04-05 ENCOUNTER — Telehealth (HOSPITAL_COMMUNITY): Payer: Self-pay

## 2020-04-05 NOTE — Telephone Encounter (Signed)
Telephone call from mom who reports she has been exclusively pumping because she has inverted nipples and baby gets too fussy trying to latch.  Mom reports she has been using a [place] since she got home from the hospital. Mom reports pumping 4 times day.  Mom reports she was getting 6-8 oz day in the first week with pumping.  Now nothing.     Mom reports for the past three days her nipples are sore and she cant get anything out with pumping.  Mom has not started anything for Birth control and no longer has any bleeding. Mom reports she has been Butalbital for headaches.  Mom reports that RN said that maybe that was affecting supply.  Per Medications and mothers book that should not be affecting supply.   Mom reports that the RN came to check baby's weight because baby has not been gaining well.    Mom has a Medela manual pump she got from hospital.  Discussed flange fit.  Mom reports that she feels her nipples maybe rubbing but that she cant see them inside the willow pump.   Discussed possible loaner pump or pump rental to see if maybe it was her pump.  Mom reports her breasts aren't very full right now and she really hasn't pumped in two days because her nipples were so sore.   Mom wants to see outpatient lactation.  Sent referral to outpatient lactation.

## 2020-04-08 DIAGNOSIS — L02416 Cutaneous abscess of left lower limb: Secondary | ICD-10-CM | POA: Diagnosis not present

## 2020-04-20 DIAGNOSIS — K649 Unspecified hemorrhoids: Secondary | ICD-10-CM | POA: Diagnosis not present

## 2020-04-20 DIAGNOSIS — G43909 Migraine, unspecified, not intractable, without status migrainosus: Secondary | ICD-10-CM | POA: Diagnosis not present

## 2020-04-20 DIAGNOSIS — Z1389 Encounter for screening for other disorder: Secondary | ICD-10-CM | POA: Diagnosis not present

## 2020-04-20 DIAGNOSIS — Z3040 Encounter for surveillance of contraceptives, unspecified: Secondary | ICD-10-CM | POA: Diagnosis not present

## 2020-04-21 DIAGNOSIS — L810 Postinflammatory hyperpigmentation: Secondary | ICD-10-CM | POA: Diagnosis not present

## 2020-04-21 DIAGNOSIS — B081 Molluscum contagiosum: Secondary | ICD-10-CM | POA: Diagnosis not present

## 2020-04-21 DIAGNOSIS — B078 Other viral warts: Secondary | ICD-10-CM | POA: Diagnosis not present

## 2020-04-26 ENCOUNTER — Ambulatory Visit: Payer: Self-pay | Attending: Internal Medicine

## 2020-04-26 DIAGNOSIS — Z23 Encounter for immunization: Secondary | ICD-10-CM

## 2020-04-26 NOTE — Progress Notes (Signed)
   Covid-19 Vaccination Clinic  Name:  Destiny Keith    MRN: [id number] DOB: [birth year]  04/26/2020  [name] was observed post Covid-19 immunization for 15 minutes without incident. She was provided with Vaccine Information Sheet and instruction to access the V-Safe system.   [name] was instructed to call 911 with any severe reactions post vaccine: Marland Kitchen Difficulty breathing  . Swelling of face and throat  . A fast heartbeat  . A bad rash all over body  . Dizziness and weakness   Immunizations Administered    Name Date Dose VIS Date Route   Pfizer COVID-19 Vaccine 04/26/2020 12:22 PM 0.3 mL 11/11/2018 Intramuscular   Manufacturer: [place]   Lot: [id number]   [place]: [address]-5

## 2020-05-10 DIAGNOSIS — Z3202 Encounter for pregnancy test, result negative: Secondary | ICD-10-CM | POA: Diagnosis not present

## 2020-05-10 DIAGNOSIS — Z30017 Encounter for initial prescription of implantable subdermal contraceptive: Secondary | ICD-10-CM | POA: Diagnosis not present

## 2020-05-17 ENCOUNTER — Ambulatory Visit: Payer: BC Managed Care – PPO | Attending: Critical Care Medicine

## 2020-05-17 DIAGNOSIS — Z23 Encounter for immunization: Secondary | ICD-10-CM

## 2020-05-17 NOTE — Progress Notes (Signed)
   Covid-19 Vaccination Clinic  Name:  Destiny Keith    MRN: [id number] DOB: 10-15-1985  05/17/2020  [name] was observed post Covid-19 immunization for 15 minutes without incident. She was provided with Vaccine Information Sheet and instruction to access the V-Safe system.   [name] was instructed to call 911 with any severe reactions post vaccine: Marland Kitchen Difficulty breathing  . Swelling of face and throat  . A fast heartbeat  . A bad rash all over body  . Dizziness and weakness   Immunizations Administered    Name Date Dose VIS Date Route   Pfizer COVID-19 Vaccine 05/17/2020  1:07 PM 0.3 mL 11/11/2018 Intramuscular   Manufacturer: [place]   Lot: [id number]   [place]: [address]-0

## 2020-06-14 DIAGNOSIS — N760 Acute vaginitis: Secondary | ICD-10-CM | POA: Diagnosis not present

## 2021-02-03 ENCOUNTER — Ambulatory Visit (HOSPITAL_BASED_OUTPATIENT_CLINIC_OR_DEPARTMENT_OTHER): Payer: BC Managed Care – PPO | Admitting: Nurse Practitioner

## 2021-02-16 ENCOUNTER — Other Ambulatory Visit (HOSPITAL_BASED_OUTPATIENT_CLINIC_OR_DEPARTMENT_OTHER): Payer: Self-pay | Admitting: Nurse Practitioner

## 2021-02-16 ENCOUNTER — Other Ambulatory Visit: Payer: Self-pay

## 2021-02-16 ENCOUNTER — Ambulatory Visit (HOSPITAL_BASED_OUTPATIENT_CLINIC_OR_DEPARTMENT_OTHER): Payer: BC Managed Care – PPO | Admitting: Nurse Practitioner

## 2021-02-16 ENCOUNTER — Encounter (HOSPITAL_BASED_OUTPATIENT_CLINIC_OR_DEPARTMENT_OTHER): Payer: Self-pay | Admitting: Nurse Practitioner

## 2021-02-16 VITALS — BP 111/62 | HR 74 | Ht 62.0 in | Wt 153.8 lb

## 2021-02-16 DIAGNOSIS — R748 Abnormal levels of other serum enzymes: Secondary | ICD-10-CM | POA: Diagnosis not present

## 2021-02-16 DIAGNOSIS — Z6828 Body mass index (BMI) 28.0-28.9, adult: Secondary | ICD-10-CM

## 2021-02-16 DIAGNOSIS — N898 Other specified noninflammatory disorders of vagina: Secondary | ICD-10-CM | POA: Diagnosis not present

## 2021-02-16 DIAGNOSIS — Z7689 Persons encountering health services in other specified circumstances: Secondary | ICD-10-CM | POA: Diagnosis not present

## 2021-02-16 DIAGNOSIS — K5904 Chronic idiopathic constipation: Secondary | ICD-10-CM | POA: Diagnosis not present

## 2021-02-16 DIAGNOSIS — R768 Other specified abnormal immunological findings in serum: Secondary | ICD-10-CM | POA: Insufficient documentation

## 2021-02-16 DIAGNOSIS — K649 Unspecified hemorrhoids: Secondary | ICD-10-CM | POA: Insufficient documentation

## 2021-02-16 DIAGNOSIS — N926 Irregular menstruation, unspecified: Secondary | ICD-10-CM

## 2021-02-16 LAB — POCT URINALYSIS DIPSTICK
Bilirubin, UA: NEGATIVE
Blood, UA: NEGATIVE
Glucose, UA: NEGATIVE
Ketones, UA: NEGATIVE
Leukocytes, UA: NEGATIVE
Nitrite, UA: NEGATIVE
Protein, UA: NEGATIVE
Spec Grav, UA: 1.01 (ref 1.010–1.025)
Urobilinogen, UA: 0.2 E.U./dL
pH, UA: 7 (ref 5.0–8.0)

## 2021-02-16 MED ORDER — ESTRADIOL 2 MG PO TABS: ORAL_TABLET | ORAL | 3 refills | Status: DC

## 2021-02-16 NOTE — Assessment & Plan Note (Addendum)
BMI 28.13 today. Working on diet and exercise modifications.  Continue great efforts. Plan for follow-up in October for CPE.

## 2021-02-16 NOTE — Assessment & Plan Note (Signed)
Review of current and past medical history, social history, medication, and family history.  Review of care gaps and health maintenance recommendations.  Records from recent providers to be requested if not available in Chart Review or Care Everywhere.  Recommendations for health maintenance, diet, and exercise provided.   

## 2021-02-16 NOTE — Assessment & Plan Note (Addendum)
Historical elevation in ALT (35) with normal AST in 06/2020.  Follow-up with GI for additional labs and testing performed. Records provided from [facility] today from 10/2020. 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 Records indicate ferritin was low and TIBC was elevated with positive ANA. RNP antibody and Actin antibodies mildly positive. Given this information- recommendations include initial monitoring of labs: CBC CMP Hgb A1c Iron, Ferritin, TIBC Lipids Will consider repeat Fibroscan based on lab values.

## 2021-02-16 NOTE — Assessment & Plan Note (Signed)
Lifelong history of constipation with intermittent normal BM reportedly worse with increased stressors.  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 Recommend daily stool softener use for 3-4 weeks until stools regularly soft and formed- if stools become too soft or liquid, reduce use to every other day. Continue fiber, diet, exercise, and water intake If symptoms do not improve with stool softener use, consider Linzess for symptom management.

## 2021-02-16 NOTE — Assessment & Plan Note (Signed)
Irregular menses with prolonged bleeding cycles associated with nexplanon implant.  Discussed with patient the use of estradiol 2mg  on days 1-7 of menses to help reduce the length of menstrual bleeding. She is agreeable to this plan.  Recommend trial of this method for 3 cycles, if not effective at regulating menses or preventing prolonged cycles, will consider other options.

## 2021-02-16 NOTE — Patient Instructions (Addendum)
Recommendations from today's visit: . 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 You are doing the right things to help with this, which is great.  o I also recommend trying stool softener (docusate sodium or Colace) daily for about 3-4 weeks and then back off to every other day or three days a week. If this doesn't help, please let me know and we can look at some other options. . I will run a test today for bacterial vaginosis and yeast and let you know what this shows. I will send in medication for you as soon as we find out what is causing the discharge and pain.  . An option to help with the long periods is using a medication called estradiol. You would take this on the first day of bleeding a keep taking it for 7-10 days to help stop bleeding. Sometimes this helps "reset" the bleeding after one to two cycles.   Information on diet, exercise, and health maintenance recommendations are listed below. This is information to help you be sure you are on track for optimal health and monitoring.   Please look over this and let us know if you have any questions or if you have completed any of the health maintenance outside of [facility] so that we can be sure your records are up to date.  ___________________________________________________________  Thank you for choosing [facility] for your Primary Care needs. I am excited for the opportunity to partner with you to meet your health care goals. It was a pleasure meeting you today!  I am an Adult-Geriatric Nurse Practitioner with a background in caring for patients for more than 20 years. I received my Paediatric nurse in Nursing and my Doctor of Nursing Practice degrees at [employer]. I received additional fellowship training in primary care and sports medicine after receiving my doctorate degree. I provide primary care and sports medicine services to patients age 9 and older within this  office. I am also a provider with the [facility] and the director of the APP Fellowship with [facility].  I am a [place] native, but have called the [facility] [place] area home for nearly 20 years and am proud to be a member of this community.   I am passionate about providing the best service to you through preventive medicine and supportive care. I consider you a part of the medical team and value your input. I work diligently to ensure that you are heard and your needs are met in a safe and effective manner. I want you to feel comfortable with me as your provider and want you to know that your health concerns are important to me.   For your information, our office hours are Monday- Friday 8:00 AM - 5:00 PM At this time I am not in the office on Wednesdays.  If you have questions or concerns, please call our office at [phone] or send [place] a MyChart message and we will respond as quickly as possible.   For all urgent or time sensitive needs we ask that you please call the office to avoid delays. MyChart is not constantly monitored and replies may take up to 72 business hours.  MyChart Policy: . MyChart allows for you to see your visit notes, after visit summary, provider recommendations, lab and tests results, make an appointment, request refills, and contact your provider or the office for non-urgent questions or concerns.  Marland Kitchen  Providers are seeing patients during normal business hours and do not have built in time to review MyChart messages. We ask that you allow a minimum of 72 business hours for MyChart message responses.  . Complex MyChart concerns may require a visit. Your provider may request you schedule a virtual or in person visit to ensure we are providing the best care possible. . MyChart messages sent after 4:00 PM on Friday will not be received by the provider until Monday morning.    Lab and Test Results: . You will receive your lab and  test results on MyChart as soon as they are completed and results have been sent by the lab or testing facility. Due to this service, you will receive your results BEFORE your provider.  . Please allow a minimum of 72 business hours for your provider to receive and review lab and test results and contact you about.   . Most lab and test result comments from the provider will be sent through [facility]. Your provider may recommend changes to the plan of care, follow-up visits, repeat testing, ask questions, or request an office visit to discuss these results. You may reply directly to this message or call the office at [phone] to provide information for the provider or set up an appointment. . In some instances, you will be called with test results and recommendations. Please let us know if this is preferred and we will make note of this in your chart to provide this for you.    . If you have not heard a response to your lab or test results in 72 business hours, please call the office to let us know.   After Hours: . For all non-emergency after hours needs, please call the office at [phone] and select the option to reach the on-call provider service. On-call services are shared between multiple [facility] offices and therefore it will not be possible to speak directly with your provider. On-call providers may provide medical advice and recommendations, but are unable to provide refills for maintenance medications.  . For all emergency or urgent medical needs after normal business hours, we recommend that you seek care at the closest Urgent Care or Emergency Department to ensure appropriate treatment in a timely manner.  Nigel Bridgeman  at [place] has a 24 hour emergency room located on the ground floor for your convenience.    Please do not hesitate to reach out to [place] with concerns.   Thank you, again, for choosing me as your health care partner. I appreciate your trust and look  forward to learning more about you.   [name], DNP, AGNP-c ___________________________________________________________  Health Maintenance Recommendations Screening Testing  Mammogram  Every 1 -2 years based on history and risk factors  Starting at age 16  Pap Smear  Ages 21-39 every 3 years  Ages 13-65 every 5 years with HPV testing  More frequent testing may be required based on results and history  Colon Cancer Screening  Every 1-10 years based on test performed, risk factors, and history  Starting at age 68  Bone Density Screening  Every 2-10 years based on history  Starting at age 53 for women  Recommendations for men differ based on medication usage, history, and risk factors  AAA Screening  One time ultrasound  Men 24-50 years old who have every smoked  Lung Cancer Screening  Low Dose Lung CT every 12 months  Age 39-80 years with a 30 pack-year smoking history who  still smoke or who have quit within the last 15 years  Screening Labs  Routine  Labs: Complete Blood Count (CBC), Complete Metabolic Panel (CMP), Cholesterol (Lipid Panel)  Every 6-12 months based on history and medications  May be recommended more frequently based on current conditions or previous results  Hemoglobin A1c Lab  Every 3-12 months based on history and previous results  Starting at age 51 or earlier with diagnosis of diabetes, high cholesterol, BMI >26, and/or risk factors  Frequent monitoring for patients with diabetes to ensure blood sugar control  Thyroid Panel (TSH w/ T3 & T4)  Every 6 months based on history, symptoms, and risk factors  May be repeated more often if on medication  HIV  One time testing for all patients 26 and older  May be repeated more frequently for patients with increased risk factors or exposure  Hepatitis C  One time testing for all patients 6 and older  May be repeated more frequently for patients with increased risk  factors or exposure  Gonorrhea, Chlamydia  Every 12 months for all sexually active persons 13-24 years  Additional monitoring may be recommended for those who are considered high risk or who have symptoms  PSA  Men 45-56 years old with risk factors  Additional screening may be recommended from age 29-69 based on risk factors, symptoms, and history  Vaccine Recommendations  Tetanus Booster  All adults every 10 years  Flu Vaccine  All patients 6 months and older every year  COVID Vaccine  All patients 12 years and older  Initial dosing with booster  May recommend additional booster based on age and health history  HPV Vaccine  2 doses all patients age 58-26  Dosing may be considered for patients over 26  Shingles Vaccine (Shingrix)  2 doses all adults 72 years and older  Pneumonia (Pneumovax 23)  All adults 19 years and older  May recommend earlier dosing based on health history  Pneumonia (Prevnar 37)  All adults 46 years and older  Dosed 1 year after Pneumovax 23  Additional Screening, Testing, and Vaccinations may be recommended on an individualized basis based on family history, health history, risk factors, and/or exposure.  __________________________________________________________  Diet Recommendations for All Patients  I recommend that all patients maintain a diet low in saturated fats, carbohydrates, and cholesterol. While this can be challenging at first, it is not impossible and small changes can make big differences.  Things to try: Marland Kitchen Decreasing the amount of soda, sweet tea, and/or juice to one or less per day and replace with water o While water is always the first choice, if you do not like water you may consider - adding a water additive without sugar to improve the taste - other sugar free drinks . Replace potatoes with a brightly colored vegetable at dinner . Use healthy oils, such as canola oil or olive oil, instead of butter or hard  margarine . Limit your bread intake to two pieces or less a day . Replace regular pasta with low carb pasta options . Bake, broil, or grill foods instead of frying . Monitor portion sizes  . Eat smaller, more frequent meals throughout the day instead of large meals  An important thing to remember is, if you love foods that are not great for your health, you don't have to give them up completely. Instead, allow these foods to be a reward when you have done well. Allowing yourself to still have special treats every once  in a while is a nice way to tell yourself thank you for working hard to keep yourself healthy.   Also remember that every day is a new day. If you have a bad day and "fall off the wagon", you can still climb right back up and keep moving along on your journey!  We have resources available to help you!  Some websites that may be helpful include: . www.http://carter.biz/  . Www.VeryWellFit.com _____________________________________________________________  Activity Recommendations for All Patients  I recommend that all adults get at least 20 minutes of moderate physical activity that elevates your heart rate at least 5 days out of the week.  Some examples include: . Walking or jogging at a pace that allows you to carry on a conversation . Cycling (stationary bike or outdoors) . Water aerobics . Yoga . Weight lifting . Dancing If physical limitations prevent you from putting stress on your joints, exercise in a pool or seated in a chair are excellent options.  Do determine your MAXIMUM heart rate for activity: YOUR AGE - 220 = MAX HeartRate   Remember! . Do not push yourself too hard.  . Start slowly and build up your pace, speed, weight, time in exercise, etc.  . Allow your body to rest between exercise and get good sleep. . You will need more water than normal when you are exerting yourself. Do not wait until you are thirsty to drink. Drink with a purpose of getting in at  least 8, 8 ounce glasses of water a day plus more depending on how much you exercise and sweat.    If you begin to develop dizziness, chest pain, abdominal pain, jaw pain, shortness of breath, headache, vision changes, lightheadedness, or other concerning symptoms, stop the activity and allow your body to rest. If your symptoms are severe, seek emergency evaluation immediately. If your symptoms are concerning, but not severe, please let us know so that we can recommend further evaluation.   ________________________________________________________________   Irritable Bowel Syndrome, Adult  Irritable bowel syndrome (IBS) is a group of symptoms that affects the organs responsible for digestion (gastrointestinal or GI tract). IBS is not one specific disease. To regulate how the GI tract works, the body sends signals back and forth between the intestines and the brain. If you have IBS, there may be a problem with these signals. As a result, the GI tract does not function normally. The intestines may become more sensitive and overreact to certain things. This may be especially true when you eat certain foods or when you are under stress. There are four types of IBS. These may be determined based on the consistency of your stool (feces):  IBS with diarrhea.  IBS with constipation.  Mixed IBS.  Unsubtyped IBS. It is important to know which type of IBS you have. Certain treatments are more likely to be helpful for certain types of IBS. What are the causes? The exact cause of IBS is not known. What increases the risk? You may have a higher risk for IBS if you:  Are female.  Are younger than 61.  Have a family history of IBS.  Have a mental health condition, such as depression, anxiety, or post-traumatic stress disorder.  Have had a bacterial infection of your GI tract. What are the signs or symptoms? Symptoms of IBS vary from person to person. The main symptom is abdominal pain or  discomfort. Other symptoms usually include one or more of the following:  Diarrhea, constipation, or both.  Abdominal swelling or bloating.  Feeling full after eating a small or regular-sized meal.  Frequent gas.  Mucus in the stool.  A feeling of having more stool left after a bowel movement. Symptoms tend to come and go. They may be triggered by stress, mental health conditions, or certain foods. How is this diagnosed? This condition may be diagnosed based on a physical exam, your medical history, and your symptoms. You may have tests, such as:  Blood tests.  Stool test.  X-rays.  CT scan.  Colonoscopy. This is a procedure in which your GI tract is viewed with a long, thin, flexible tube. How is this treated? There is no cure for IBS, but treatment can help relieve symptoms. Treatment depends on the type of IBS you have, and may include:  Changes to your diet, such as: ? Avoiding foods that cause symptoms. ? Drinking more water. ? Following a low-FODMAP (fermentable oligosaccharides, disaccharides, monosaccharides, and polyols) diet for up to 6 weeks, or as told by your health care provider. FODMAPs are sugars that are hard for some people to digest. ? Eating more fiber. ? Eating medium-sized meals at the same times every day.  Medicines. These may include: ? Fiber supplements, if you have constipation. ? Medicine to control diarrhea (antidiarrheal medicines). ? Medicine to help control muscle tightening (spasms) in your GI tract (antispasmodic medicines). ? Medicines to help with mental health conditions, such as antidepressants or tranquilizers.  Talk therapy or counseling.  Working with a diet and nutrition specialist (dietitian) to help create a food plan that is right for you.  Managing your stress. Follow these instructions at home: Eating and drinking  Eat a healthy diet.  Eat medium-sized meals at about the same time every day. Do not eat large  meals.  Gradually eat more fiber-rich foods. These include whole grains, fruits, and vegetables. This may be especially helpful if you have IBS with constipation.  Eat a diet low in FODMAPs.  Drink enough fluid to keep your urine pale yellow.  Keep a journal of foods that seem to trigger symptoms.  Avoid foods and drinks that: ? Contain added sugar. ? Make your symptoms worse. Dairy products, caffeinated drinks, and carbonated drinks can make symptoms worse for some people. General instructions  Take over-the-counter and prescription medicines and supplements only as told by your health care provider.  Get enough exercise. Do at least 150 minutes of moderate-intensity exercise each week.  Manage your stress. Getting enough sleep and exercise can help you manage stress.  Keep all follow-up visits as told by your health care provider and therapist. This is important. Alcohol Use  Do not drink alcohol if: ? Your health care provider tells you not to drink. ? You are pregnant, may be pregnant, or are planning to become pregnant.  If you drink alcohol, limit how much you have: ? 0-1 drink a day for women. ? 0-2 drinks a day for men.  Be aware of how much alcohol is in your drink. In the [place] one drink equals one typical bottle of beer (12 oz), one-half glass of wine (5 oz), or one shot of hard liquor (1 oz). Contact a health care provider if you have:  Constant pain.  Weight loss.  Difficulty or pain when swallowing.  Diarrhea that gets worse. Get help right away if you have:  Severe abdominal pain.  Fever.  Diarrhea with symptoms of dehydration, such as dizziness or dry mouth.  Bright red blood in your  stool.  Stool that is black and tarry.  Abdominal swelling.  Vomiting that does not stop.  Blood in your vomit. Summary  Irritable bowel syndrome (IBS) is not one specific disease. It is a group of symptoms that affects digestion.  Your intestines may become  more sensitive and overreact to certain things. This may be especially true when you eat certain foods or when you are under stress.  There is no cure for IBS, but treatment can help relieve symptoms. This information is not intended to replace advice given to you by your health care provider. Make sure you discuss any questions you have with your health care provider. Document Revised: 05/05/2020 Document Reviewed: 05/05/2020 Elsevier Patient Education  2021 [place] stands for fermentable oligosaccharides, disaccharides, monosaccharides, and polyols. These are sugars that are hard for some people to digest. A low-FODMAP eating plan may help some people who have irritable bowel syndrome (IBS) and certain other bowel (intestinal) diseases to manage their symptoms. This meal plan can be complicated to follow. Work with a diet and nutrition specialist (dietitian) to make a low-FODMAP eating plan that is right for you. A dietitian can help make sure that you get enough nutrition from this diet. What are tips for following this plan? Reading food labels  Check labels for hidden FODMAPs such as: ? High-fructose syrup. ? Honey. ? Agave. ? Natural fruit flavors. ? Onion or garlic powder.  Choose low-FODMAP foods that contain 3-4 grams of fiber per serving.  Check food labels for serving sizes. Eat only one serving at a time to make sure FODMAP levels stay low. Shopping  Shop with a list of foods that are recommended on this diet and make a meal plan. Meal planning  Follow a low-FODMAP eating plan for up to 6 weeks, or as told by your health care provider or dietitian.  To follow the eating plan: 1. Eliminate high-FODMAP foods from your diet completely. Choose only low-FODMAP foods to eat. You will do this for 2-6 weeks. 2. Gradually reintroduce high-FODMAP foods into your diet one at a time. Most people should wait a few days before introducing the  next new high-FODMAP food into their meal plan. Your dietitian can recommend how quickly you may reintroduce foods. 3. Keep a daily record of what and how much you eat and drink. Make note of any symptoms that you have after eating. 4. Review your daily record with a dietitian regularly to identify which foods you can eat and which foods you should avoid. General tips  Drink enough fluid each day to keep your urine pale yellow.  Avoid processed foods. These often have added sugar and may be high in FODMAPs.  Avoid most dairy products, whole grains, and sweeteners.  Work with a dietitian to make sure you get enough fiber in your diet.  Avoid high FODMAP foods at meals to manage symptoms. Recommended foods Fruits Bananas, oranges, tangerines, lemons, limes, blueberries, raspberries, strawberries, grapes, cantaloupe, honeydew melon, kiwi, papaya, passion fruit, and pineapple. Limited amounts of dried cranberries, banana chips, and shredded coconut. Vegetables Eggplant, zucchini, cucumber, peppers, green beans, bean sprouts, lettuce, arugula, kale, Swiss chard, spinach, collard greens, bok choy, summer squash, potato, and tomato. Limited amounts of corn, carrot, and sweet potato. Green parts of scallions. Grains Gluten-free grains, such as rice, oats, buckwheat, quinoa, corn, polenta, and millet. Gluten-free pasta, bread, or cereal. Rice noodles. Corn tortillas. Meats and other proteins Unseasoned beef, pork, poultry,  or fish. Eggs. Berniece Salines. Tofu (firm) and tempeh. Limited amounts of nuts and seeds, such as almonds, walnuts, [place] nuts, pecans, peanuts, nut butters, pumpkin seeds, chia seeds, and sunflower seeds. Dairy Lactose-free milk, yogurt, and kefir. Lactose-free cottage cheese and ice cream. Non-dairy milks, such as almond, coconut, hemp, and rice milk. Non-dairy yogurt. Limited amounts of goat cheese, brie, mozzarella, parmesan, swiss, and other hard cheeses. Fats and oils Butter-free  spreads. Vegetable oils, such as olive, canola, and sunflower oil. Seasoning and other foods Artificial sweeteners with names that do not end in "ol," such as aspartame, saccharine, and stevia. Maple syrup, white table sugar, raw sugar, brown sugar, and molasses. Mayonnaise, soy sauce, and tamari. Fresh basil, coriander, parsley, rosemary, and thyme. Beverages Water and mineral water. Sugar-sweetened soft drinks. Small amounts of orange juice or cranberry juice. Black and green tea. Most dry wines. Coffee. The items listed above may not be a complete list of foods and beverages you can eat. Contact a dietitian for more information. Foods to avoid Fruits Fresh, dried, and juiced forms of apple, pear, watermelon, peach, plum, cherries, apricots, blackberries, boysenberries, figs, nectarines, and mango. Avocado. Vegetables Chicory root, artichoke, asparagus, cabbage, snow peas, Brussels sprouts, broccoli, sugar snap peas, mushrooms, celery, and cauliflower. Onions, garlic, leeks, and the white part of scallions. Grains Wheat, including kamut, durum, and semolina. Barley and bulgur. Couscous. Wheat-based cereals. Wheat noodles, bread, crackers, and pastries. Meats and other proteins Fried or fatty meat. Sausage. Cashews and pistachios. Soybeans, baked beans, black beans, chickpeas, kidney beans, fava beans, navy beans, lentils, black-eyed peas, and split peas. Dairy Milk, yogurt, ice cream, and soft cheese. Cream and sour cream. Milk-based sauces. Custard. Buttermilk. Soy milk. Seasoning and other foods Any sugar-free gum or candy. Foods that contain artificial sweeteners such as sorbitol, mannitol, isomalt, or xylitol. Foods that contain honey, high-fructose corn syrup, or agave. Bouillon, vegetable stock, beef stock, and chicken stock. Garlic and onion powder. Condiments made with onion, such as hummus, chutney, pickles, relish, salad dressing, and salsa. Tomato paste. Beverages Chicory-based  drinks. Coffee substitutes. Chamomile tea. Fennel tea. Sweet or fortified wines such as port or sherry. Diet soft drinks made with isomalt, mannitol, maltitol, sorbitol, or xylitol. Apple, pear, and mango juice. Juices with high-fructose corn syrup. The items listed above may not be a complete list of foods and beverages you should avoid. Contact a dietitian for more information. Summary  FODMAP stands for fermentable oligosaccharides, disaccharides, monosaccharides, and polyols. These are sugars that are hard for some people to digest.  A low-FODMAP eating plan is a short-term diet that helps to ease symptoms of certain bowel diseases.  The eating plan usually lasts up to 6 weeks. After that, high-FODMAP foods are reintroduced gradually and one at a time. This can help you find out which foods may be causing symptoms.  A low-FODMAP eating plan can be complicated. It is best to work with a dietitian who has experience with this type of plan. This information is not intended to replace advice given to you by your health care provider. Make sure you discuss any questions you have with your health care provider. Document Revised: 01/21/2020 Document Reviewed: 01/21/2020 Elsevier Patient Education  [place].

## 2021-02-16 NOTE — Progress Notes (Signed)
[name], DNP, AGNP-c Primary Care Services ______________________________________________________________________________________________________________________________________________  HPI Destiny Keith is a 35 y.o. female presenting to [facility] [place] today to establish care.   Patient Care Team: Sallie Staron, Coralee Pesa, NP as PCP - General (Nurse Practitioner) Last CPE: November 2021 Other providers seen: [facility]  Concerns today: . Evaluation of records from GI at [facility]  o Abnormal labs at PCP in 06/2020 with referral to GI for evaluation of suspected fatty liver o Scan performed and lab work-up completed 10/2020 o Patient has records with her today for review o Was told to follow-up in one year for further labs- would like records reviewed and recommendations today o No symptoms today- no jaundice, light colored stools, abdominal pain, weakness, itching, or dark urine.  . Menstrual Irregularities o Nexplanon implant after delivery of first baby last year o Irregular menses with long, heavy cycles since implant placement o Some periods lasting 3 weeks o Not currently planning pregnancy . Vaginal Discharge and Odor o Endorses increased white vaginal discharge about 3 weeks ago o No vaginal itching or burning o "bad odor" present and pain with intercourse in left lower abdomen o Sexually active with spouse- monogamous- no Hx of STI o No fever, chills, body aches, dysuria . Constipation o Endorses lifelong constipation o Has increased fiber, eats healthy foods, increased water intake, exercises regularly with no change o Painful, hard BM about every 4 days, but other days will have normal BM. If doesn't go on own, will use dulcolax o Abdominal cramping with relief with BM o Used miralax in the past, but caused painful gas and bloating o Symptoms worse with increased anxiety. o No bleeding, fevers,  chills, severe pain present.   Narrative: Destiny Keith is married and has one son a year old. She reports she is safe in her current relationship and home. She denies any history of partner abuse.  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  She reports that she eats a healthy diet high in fiber and vegetables. She exercises regularly.   She denies nicotine and recreational drug use, she uses alcohol only on social occassions with no concerns for over indulgence.   She is currently sexually active with her husband. She has irregular menstrual periods with heavy and prolonged bleeding after Nexplanon implant. Her LMP was 01/12/21. She is considering having another child in 2023.  She denies recent changes to bowel habits, denies recent changes to bladder habits, denies recent changes to skin.  She denies recent mood related changes. PHQ and GAD listed below.  PHQ9 Today: Depression screen PHQ 2/9 02/16/2021  Decreased Interest 0  Down, Depressed, Hopeless 0  PHQ - 2 Score 0  Altered sleeping 1  Tired, decreased energy 1  Change in appetite 0  Feeling bad or failure about yourself  0  Trouble concentrating 0  Moving slowly or fidgety/restless 0  Suicidal thoughts 0  PHQ-9 Score 2  Difficult doing work/chores Somewhat difficult   GAD7 Today: GAD 7 : Generalized Anxiety Score 02/16/2021  Nervous, Anxious, on Edge 0  Control/stop worrying 0  Worry too much - different things 0  Trouble relaxing 0  Restless 0  Easily annoyed or irritable 0  Afraid - awful might happen 0  Total GAD 7 Score 0    Health Maintenance Due  Topic Date Due  . Hepatitis C Screening  Never done  . PAP SMEAR-Modifier  Never done  .  COVID-19 Vaccine (3 - Booster for [employer] series) 10/17/2020     PMH Past Medical History:  Diagnosis Date  . Genital warts   . Headache   . Ovarian cyst     ROS All review of systems negative except what is listed in the HPI  PHYSICAL EXAM General  appearance: alert, cooperative and no distress,  Resp: clear to auscultation bilaterally,  Cardio: regular rate and rhythm, S1, S2 normal, no murmur, click, rub or gallop,  GI: soft, bowel sounds normal; no masses,  no organomegaly, liver non-tender and non-distended. TENDERNESS WITH DEEP PALPATION TO THE L ADNEXAL AREA PRESENT Extremities: extremities normal, atraumatic, no cyanosis or edema GU: labia intact with no erythema, edema, or excoriation, white discharge present, no odor detected, no labial or vaginal tenderness noted. ([place], [place] acted as Producer, television/film/video for exam)   ASSESSMENT AND PLAN Problem List Items Addressed This Visit    Encounter to establish care - Primary    Review of current and past medical history, social history, medication, and family history.  Review of care gaps and health maintenance recommendations.  Records from recent providers to be requested if not available in Chart Review or Care Everywhere.  Recommendations for health maintenance, diet, and exercise provided.        Vaginal discharge    UA negative today Vaginal swab for trich, yeast, clue sent. No alarm signs present.  Will wait for lab results for recommendations for treatment      Relevant Orders   POCT urinalysis dipstick (Completed)   Wet prep, genital   Chronic idiopathic constipation    Lifelong history of constipation with intermittent normal BM reportedly worse with increased stressors.  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 Recommend daily stool softener use for 3-4 weeks until stools regularly soft and formed- if stools become too soft or liquid, reduce use to every other day. Continue fiber, diet, exercise, and water intake If symptoms do not improve with stool softener use, consider Linzess for symptom management.       Increased liver enzymes    Historical elevation in ALT (35) with normal AST in 06/2020.  Follow-up with GI for additional  labs and testing performed. Records provided from [facility] today from 10/2020. 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 Records indicate ferritin was low and TIBC was elevated with positive ANA. RNP antibody and Actin antibodies mildly positive. Given this information- recommendations include initial monitoring of labs: CBC CMP Hgb A1c Iron, Ferritin, TIBC Lipids Will consider repeat Fibroscan based on lab values.      Menstrual cycle disorder    Irregular menses with prolonged bleeding cycles associated with nexplanon implant.  Discussed with patient the use of estradiol 2mg  on days 1-7 of menses to help reduce the length of menstrual bleeding. She is agreeable to this plan.  Recommend trial of this method for 3 cycles, if not effective at regulating menses or preventing prolonged cycles, will consider other options.       Relevant Medications   estradiol (ESTRACE) 2 MG tablet   Body mass index 28.0-28.9, adult    BMI 28.13 today. Working on diet and exercise modifications.  Continue great efforts. Plan for follow-up in October for CPE.        ANA positive    Historical positive ANA based on records reviewed from [facility] today.  RNP antibodies positive with mild  elevation in Actin antibodies present.  In the setting of suggested NAFLD, will follow and evaluate for further evidence of connective tissue disorder.  Rheumatology f/u to be discussed with patient.          Education provided today during visit and on AVS for patient to review at home.  Diet and Exercise recommendations provided.  Current diagnoses and recommendations discussed. HM recommendations reviewed with recommendations.    Outpatient Encounter Medications as of 02/16/2021  Medication Sig  . acetaminophen (TYLENOL) 325 MG tablet Take 650 mg by mouth every 6 (six) hours as needed for mild pain.  Marland Kitchen aspirin-acetaminophen-caffeine  (EXCEDRIN MIGRAINE) 250-250-65 MG tablet Take by mouth every 6 (six) hours as needed for headache.  . etonogestrel (NEXPLANON) 68 MG IMPL implant Nexplanon 68 mg subdermal implant  Inject by subcutaneous route.  Marland Kitchen ibuprofen (ADVIL) 600 MG tablet Take 1 tablet (600 mg total) by mouth every 6 (six) hours as needed.  . influenza vac split quadrivalent PF (FLUZONE QUADRIVALENT) 0.5 ML injection Fluzone Quad 2020-2021 (PF) 60 mcg (15 mcg x 4)/0.5 mL IM syringe  ADM 0.5ML IM UTD  . [DISCONTINUED] butalbital-acetaminophen-caffeine (FIORICET WITH CODEINE) 50-325-40-30 MG capsule butalbital 50 mg-acetaminophen 325 mg-caffeine 40 mg-codeine 30 mg cap  . [DISCONTINUED] imiquimod (ALDARA) 5 % cream imiquimod 5 % topical cream packet  . [DISCONTINUED] ondansetron (ZOFRAN-ODT) 8 MG disintegrating tablet ondansetron 8 mg disintegrating tablet  . [DISCONTINUED] Prenatal Vit-DSS-Fe Fum-FA (PRENATAL 19) tablet Take 1 tablet by mouth daily.  . [DISCONTINUED] Prenatal Vit-Fe Fumarate-FA (PRENATAL MULTIVITAMIN) TABS tablet Take 1 tablet by mouth daily at 12 noon.  . [DISCONTINUED] terconazole (TERAZOL 3) 0.8 % vaginal cream terconazole 0.8 % vaginal cream  Insert 1 applicatorful every day by vaginal route at bedtime for 3 days.  Marland Kitchen estradiol (ESTRACE) 2 MG tablet Take 1 tab (2mg ) by mouth for 7 days starting on first day of vaginal bleeding. Repeat with each cycle.  . [DISCONTINUED] famotidine (PEPCID) 20 MG tablet Take 1 tablet (20 mg total) by mouth 2 (two) times daily. (Patient not taking: Reported on 03/10/2020)   No facility-administered encounter medications on file as of 02/16/2021.    Return for next few weeks for CPE and Labs.  Time: 75 minutes, >50% spent counseling, care coordination, chart review, and documentation.   Orma Render, DNP, AGNP-c

## 2021-02-16 NOTE — Assessment & Plan Note (Signed)
UA negative today Vaginal swab for trich, yeast, clue sent. No alarm signs present.  Will wait for lab results for recommendations for treatment

## 2021-02-16 NOTE — Assessment & Plan Note (Signed)
Historical positive ANA based on records reviewed from [facility] today.  RNP antibodies positive with mild elevation in Actin antibodies present.  In the setting of suggested NAFLD, will follow and evaluate for further evidence of connective tissue disorder.  Rheumatology f/u to be discussed with patient.

## 2021-02-20 ENCOUNTER — Encounter (HOSPITAL_BASED_OUTPATIENT_CLINIC_OR_DEPARTMENT_OTHER): Payer: Self-pay | Admitting: Nurse Practitioner

## 2021-02-20 LAB — WET PREP, GENITAL

## 2021-02-22 ENCOUNTER — Other Ambulatory Visit (HOSPITAL_BASED_OUTPATIENT_CLINIC_OR_DEPARTMENT_OTHER): Payer: Self-pay | Admitting: Nurse Practitioner

## 2021-02-22 DIAGNOSIS — N760 Acute vaginitis: Secondary | ICD-10-CM

## 2021-02-22 MED ORDER — METRONIDAZOLE 500 MG PO TABS: 500.0000 mg | ORAL_TABLET | Freq: Two times a day (BID) | ORAL | 0 refills | Status: AC

## 2021-02-27 LAB — SPECIMEN STATUS REPORT

## 2021-02-27 LAB — NUSWAB VAGINITIS PLUS (VG+)
Atopobium vaginae: HIGH Score — AB
BVAB 2: HIGH Score — AB
Candida albicans, NAA: NEGATIVE
Candida glabrata, NAA: NEGATIVE
Chlamydia trachomatis, NAA: NEGATIVE
Megasphaera 1: HIGH Score — AB
Neisseria gonorrhoeae, NAA: NEGATIVE
Trich vag by NAA: NEGATIVE

## 2021-03-03 ENCOUNTER — Other Ambulatory Visit (HOSPITAL_BASED_OUTPATIENT_CLINIC_OR_DEPARTMENT_OTHER): Payer: Self-pay | Admitting: Nurse Practitioner

## 2021-03-03 DIAGNOSIS — N760 Acute vaginitis: Secondary | ICD-10-CM

## 2021-03-03 MED ORDER — METRONIDAZOLE 500 MG PO TABS: 500.0000 mg | ORAL_TABLET | Freq: Two times a day (BID) | ORAL | 0 refills | Status: AC

## 2021-03-03 NOTE — Progress Notes (Signed)
BV positive Treatment sent and pt update via [facility]

## 2021-03-08 ENCOUNTER — Other Ambulatory Visit (HOSPITAL_BASED_OUTPATIENT_CLINIC_OR_DEPARTMENT_OTHER): Payer: Self-pay | Admitting: Nurse Practitioner

## 2021-03-08 DIAGNOSIS — N926 Irregular menstruation, unspecified: Secondary | ICD-10-CM

## 2021-03-08 DIAGNOSIS — N939 Abnormal uterine and vaginal bleeding, unspecified: Secondary | ICD-10-CM | POA: Insufficient documentation

## 2021-03-08 MED ORDER — MEDROXYPROGESTERONE ACETATE 10 MG PO TABS: 10.0000 mg | ORAL_TABLET | Freq: Every day | ORAL | 0 refills | Status: DC

## 2021-03-11 ENCOUNTER — Other Ambulatory Visit (HOSPITAL_BASED_OUTPATIENT_CLINIC_OR_DEPARTMENT_OTHER): Payer: Self-pay | Admitting: Nurse Practitioner

## 2021-03-11 DIAGNOSIS — N926 Irregular menstruation, unspecified: Secondary | ICD-10-CM

## 2021-03-17 ENCOUNTER — Other Ambulatory Visit (HOSPITAL_BASED_OUTPATIENT_CLINIC_OR_DEPARTMENT_OTHER): Payer: Self-pay | Admitting: Nurse Practitioner

## 2021-03-17 DIAGNOSIS — N939 Abnormal uterine and vaginal bleeding, unspecified: Secondary | ICD-10-CM

## 2021-03-17 DIAGNOSIS — R102 Pelvic and perineal pain: Secondary | ICD-10-CM

## 2021-03-29 ENCOUNTER — Ambulatory Visit
Admission: RE | Admit: 2021-03-29 | Discharge: 2021-03-29 | Disposition: A | Discharge status: Home / Self Care | Payer: No Typology Code available for payment source | Source: Ambulatory Visit | Attending: Nurse Practitioner | Admitting: Nurse Practitioner

## 2021-03-29 DIAGNOSIS — N939 Abnormal uterine and vaginal bleeding, unspecified: Secondary | ICD-10-CM

## 2021-03-29 DIAGNOSIS — R102 Pelvic and perineal pain: Secondary | ICD-10-CM

## 2021-03-31 ENCOUNTER — Encounter (HOSPITAL_BASED_OUTPATIENT_CLINIC_OR_DEPARTMENT_OTHER): Payer: Self-pay | Admitting: Nurse Practitioner

## 2021-04-03 NOTE — Progress Notes (Signed)
Please call Destiny Keith, your ultrasound shows a few changes that I would like to have evaluated by a gynecologist.  There is an area in the lining of the uterus that shows a growth. Most often this is a fibroid- these are non-cancerous growths that extend from the lining of the uterus and can sometimes cause heavy vaginal bleeding. Unfortunately, we can't tell just from the images for sure which is why it is best to see a gynecologist for further evaluation.   There are also two cysts present on your ovaries- one on each one.  They recommend a repeat ultrasound in 6-12 weeks to see if these have resolved.   Do you have a preference for a Gynecologist to send you to? I would like to have them review this and make recommendations.

## 2021-04-05 ENCOUNTER — Telehealth (HOSPITAL_BASED_OUTPATIENT_CLINIC_OR_DEPARTMENT_OTHER): Payer: Self-pay

## 2021-04-05 ENCOUNTER — Telehealth (HOSPITAL_BASED_OUTPATIENT_CLINIC_OR_DEPARTMENT_OTHER): Payer: Self-pay | Admitting: Nurse Practitioner

## 2021-04-05 ENCOUNTER — Encounter (HOSPITAL_BASED_OUTPATIENT_CLINIC_OR_DEPARTMENT_OTHER): Payer: Self-pay | Admitting: Nurse Practitioner

## 2021-04-05 NOTE — Telephone Encounter (Signed)
Please send the results of the ultrasound to Dr. Everlene Farrier with [facility] for this patient. I have printed them off.

## 2021-04-05 NOTE — Telephone Encounter (Signed)
-----   Message from Orma Render, NP sent at 04/03/2021  8:24 AM EDT ----- Please call Anabel Bene, your ultrasound shows a few changes that I would like to have evaluated by a gynecologist.  There is an area in the lining of the uterus that shows a growth. Most often this is a fibroid- these are non-cancerous growths that extend from the lining of the uterus and can sometimes cause heavy vaginal bleeding. Unfortunately, we can't tell just from the images for sure which is why it is best to see a gynecologist for further evaluation.   There are also two cysts present on your ovaries- one on each one.  They recommend a repeat ultrasound in 6-12 weeks to see if these have resolved.   Do you have a preference for a Gynecologist to send you to? I would like to have them review this and make recommendations.

## 2021-04-05 NOTE — Telephone Encounter (Signed)
Called patient to discuss ultrasound result.  Patient is aware and understands.  Faxed ultrasound report to Dr.James Tomblin ([facility]).  Instructed patient to contact the office with questions and concerns.

## 2021-04-06 ENCOUNTER — Encounter (HOSPITAL_BASED_OUTPATIENT_CLINIC_OR_DEPARTMENT_OTHER): Payer: Self-pay

## 2021-04-06 NOTE — Progress Notes (Unsigned)
Faxed ultrasound report to [facility] (Dr. Everlene Farrier) on 04/05/21 to [phone].

## 2021-06-05 ENCOUNTER — Other Ambulatory Visit: Payer: Self-pay

## 2021-06-05 ENCOUNTER — Encounter (HOSPITAL_BASED_OUTPATIENT_CLINIC_OR_DEPARTMENT_OTHER): Payer: Self-pay | Admitting: Nurse Practitioner

## 2021-06-05 ENCOUNTER — Ambulatory Visit (HOSPITAL_BASED_OUTPATIENT_CLINIC_OR_DEPARTMENT_OTHER): Payer: No Typology Code available for payment source | Admitting: Nurse Practitioner

## 2021-06-05 VITALS — BP 112/62 | HR 85 | Ht 62.0 in | Wt 155.8 lb

## 2021-06-05 DIAGNOSIS — J029 Acute pharyngitis, unspecified: Secondary | ICD-10-CM | POA: Insufficient documentation

## 2021-06-05 DIAGNOSIS — J0110 Acute frontal sinusitis, unspecified: Secondary | ICD-10-CM | POA: Diagnosis not present

## 2021-06-05 LAB — POCT RAPID STREP A (OFFICE): Rapid Strep A Screen: NEGATIVE

## 2021-06-05 MED ORDER — AMOXICILLIN-POT CLAVULANATE 875-125 MG PO TABS: 1.0000 | ORAL_TABLET | Freq: Two times a day (BID) | ORAL | 0 refills | Status: DC

## 2021-06-05 NOTE — Progress Notes (Signed)
Acute Office Visit  Subjective:    Patient ID: Destiny Keith, female    DOB: [birth year], 35 y.o.   MRN: [id number]  Chief Complaint  Patient presents with   Sore Throat    With sinus and ear pain    HPI Patient is in today for sore throat and sinus pain and pressure that started last week. Her son is new in daycare and came home with symptoms Destiny Keith last week and she began to develop symptoms on Friday.  She denies cough, fever, chills, bodyaches, nausea, vomiting. She endorses sinus pain and pressure, congestion, fatigue, very sore throat, and white patches in the posterior oropharynx.  She has been using hydrogen peroxide gargles and throat lozenges, which have helped her throat.  Her biggest concern at this time is pain in the sinuses and ears.    Past Medical History:  Diagnosis Date   Genital warts    Headache    Ovarian cyst     Past Surgical History:  Procedure Laterality Date   NO PAST SURGERIES      Family History  Problem Relation Age of Onset   Diabetes Maternal Grandmother    Hypertension Maternal Grandmother    Thyroid disease Maternal Grandmother     Social History   Socioeconomic History   Marital status: Married    Spouse name: Not on file   Number of children: Not on file   Years of education: Not on file   Highest education level: Not on file  Occupational History   Not on file  Tobacco Use   Smoking status: Never   Smokeless tobacco: Never  Vaping Use   Vaping Use: Never used  Substance and Sexual Activity   Alcohol use: Not Currently   Drug use: Never   Sexual activity: Yes  Other Topics Concern   Not on file  Social History Narrative   Not on file   Social Determinants of Health   Financial Resource Strain: Not on file  Food Insecurity: Not on file  Transportation Needs: Not on file  Physical Activity: Not on file  Stress: Not on file  Social Connections: Not on file  Intimate Partner Violence: Not on file     Outpatient Medications Prior to Visit  Medication Sig Dispense Refill   acetaminophen (TYLENOL) 325 MG tablet Take 650 mg by mouth every 6 (six) hours as needed for mild pain.     aspirin-acetaminophen-caffeine (EXCEDRIN MIGRAINE) 250-250-65 MG tablet Take by mouth every 6 (six) hours as needed for headache.     medroxyPROGESTERone (PROVERA) 10 MG tablet TAKE 1 TABLET (10 MG TOTAL) BY MOUTH DAILY. FOR 60 DAYS. 50 tablet 1   estradiol (ESTRACE) 2 MG tablet Take 2 mg by mouth daily.     etonogestrel (NEXPLANON) 68 MG IMPL implant Nexplanon 68 mg subdermal implant  Inject by subcutaneous route. (Patient not taking: Reported on 06/05/2021)     ibuprofen (ADVIL) 600 MG tablet Take 1 tablet (600 mg total) by mouth every 6 (six) hours as needed. (Patient not taking: Reported on 06/05/2021) 60 tablet 0   No facility-administered medications prior to visit.    No Known Allergies  Review of Systems All review of systems negative except what is listed in the HPI     Objective:    Physical Exam Vitals and nursing note reviewed.  Constitutional:      Appearance: She is well-developed. She is ill-appearing.  HENT:     Head: Normocephalic.  Comments: Frontal sinus tenderness present.     Right Ear: Tenderness present. A middle ear effusion is present.     Left Ear: Tenderness present. A middle ear effusion is present.     Nose: Congestion and rhinorrhea present.     Mouth/Throat:     Mouth: Mucous membranes are moist. No oral lesions.     Pharynx: Uvula midline. Posterior oropharyngeal erythema present. No pharyngeal swelling or oropharyngeal exudate.     Tonsils: No tonsillar exudate or tonsillar abscesses. 2+ on the right. 2+ on the left.  Eyes:     Conjunctiva/sclera: Conjunctivae normal.     Pupils: Pupils are equal, round, and reactive to light.  Cardiovascular:     Rate and Rhythm: Normal rate and regular rhythm.     Heart sounds: Normal heart sounds.  Pulmonary:     Effort:  Pulmonary effort is normal.     Breath sounds: Normal breath sounds.  Abdominal:     General: Bowel sounds are normal.     Palpations: Abdomen is soft.  Musculoskeletal:     Cervical back: Normal range of motion.  Lymphadenopathy:     Cervical: Cervical adenopathy present.  Skin:    General: Skin is warm and dry.     Capillary Refill: Capillary refill takes less than 2 seconds.  Neurological:     General: No focal deficit present.     Mental Status: She is alert.  Psychiatric:        Mood and Affect: Mood normal.    Ht 5' 2"  (1.575 m)   Wt 155 lb 12.8 oz (70.7 kg)   LMP 05/11/2021 (Exact Date)   BMI 28.50 kg/m  Wt Readings from Last 3 Encounters:  06/05/21 155 lb 12.8 oz (70.7 kg)  02/16/21 153 lb 12.8 oz (69.8 kg)  03/11/20 189 lb 9.5 oz (86 kg)    Health Maintenance Due  Topic Date Due   Hepatitis C Screening  Never done   PAP SMEAR-Modifier  Never done   COVID-19 Vaccine (3 - Booster for [employer] series) 10/17/2020    There are no preventive care reminders to display for this patient.   No results found for: TSH Lab Results  Component Value Date   WBC 9.3 03/12/2020   HGB 11.0 (L) 03/12/2020   HCT 32.7 (L) 03/12/2020   MCV 91.6 03/12/2020   PLT 206 03/12/2020   No results found for: NA, K, CHLORIDE, CO2, GLUCOSE, BUN, CREATININE, BILITOT, ALKPHOS, AST, ALT, PROT, ALBUMIN, CALCIUM, ANIONGAP, EGFR, GFR No results found for: CHOL No results found for: HDL No results found for: LDLCALC No results found for: TRIG No results found for: CHOLHDL No results found for: HGBA1C     Assessment & Plan:   Problem List Items Addressed This Visit     Acute sore throat    Sore throat x4-5 days after son home from pre-school with URI symptoms.  Initial exudate observed by patient has resolved. Given beefy red appearance of posterior oropharynx, will swab for strep today.  Also swab for COVID-19 given other symptoms.  Strep negative.       Relevant Orders   POCT  rapid strep A (Completed)   Novel Coronavirus, NAA ([facility])   Acute non-recurrent frontal sinusitis - Primary    Symptoms and presentation consistent with sinusitis.  Strep negative today Given appearance and symptoms present, will test for COVID- awaiting results.  Will begin treatment with augmentin BID x 5 days for sinusitis.  Instructions for OTC  treatment provided. If COVID is positive, consider antiviral medication if within 5 day window.  F/U if sx worsen or fail to improve       Relevant Medications   amoxicillin-clavulanate (AUGMENTIN) 875-125 MG tablet     Meds ordered this encounter  Medications   amoxicillin-clavulanate (AUGMENTIN) 875-125 MG tablet    Sig: Take 1 tablet by mouth 2 (two) times daily.    Dispense:  10 tablet    Refill:  0     Orma Render, NP

## 2021-06-05 NOTE — Assessment & Plan Note (Signed)
Symptoms and presentation consistent with sinusitis.  Strep negative today Given appearance and symptoms present, will test for COVID- awaiting results.  Will begin treatment with augmentin BID x 5 days for sinusitis.  Instructions for OTC treatment provided. If COVID is positive, consider antiviral medication if within 5 day window.  F/U if sx worsen or fail to improve

## 2021-06-05 NOTE — Patient Instructions (Addendum)
We have swabbed you for COVID and Strep Today. Your strep test was negative.  f your COVID test comes back positive, you will see the results in MyChart and we will contact you.   I am going to go ahead and start antibiotics today for sinusitis- I am sending in Augmentin to be taken twice a day for 5 days.   If you are not feeling any better in the next week, please let us know.   The following information is provided as a Human resources officer for ADULT patients only and does NOT take into account PREGNANCY, ALLERGIES, LIVER CONDITIONS, KIDNEY CONDITIONS, GASTROINTESTINAL CONDITIONS, OR PRESCRIPTION MEDICATION INTERACTIONS. Please be sure to ask your provider if the following are safe to take with your specific medical history, conditions, or current medication regimen if you are unsure.   Adult Basic Symptom Management for Sinusitis  Congestion: Guaifenesin (Mucinex)- follow directions on packaging with a maximum dose of '2400mg'$  in a 24 hour period.  Pain/Fever: Ibuprofen '200mg'$  - '400mg'$  every 4-6 hours as needed (MAX '1200mg'$  in a 24 hour period) Pain/Fever: Tylenol '500mg'$  -'1000mg'$  every 6-8 hours as needed (MAX '3000mg'$  in a 24 hour period)  Cough: Dextromethorphan (Delsym)- follow directions on packing with a maximum dose of '120mg'$  in a 24 hour period.  Nasal Stuffiness: Saline nasal spray and/or Nettie Pot with sterile saline solution  Runny Nose: Fluticasone nasal spray (Flonase) OR Mometasone nasal spray (Nasonex) OR Triamcinolone Acetonide nasal spray (Nasacort)- follow directions on the packaging  Pain/Pressure: Warm washcloth to the face  Sore Throat: Warm salt water gargles  If you have allergies, you may also consider taking an oral antihistamine (like Zyrtec or Claritin) as these may also help with your symptoms.  **Many medications will have more than one ingredient, be sure you are reading the packaging carefully and not taking more than one dose of the same kind of medication at the  same time or too close together. It is OK to use formulas that have all of the ingredients you want, but do not take them in a combined medication and as separate dose too close together. If you have any questions, the pharmacist will be happy to help you decide what is safe.

## 2021-06-05 NOTE — Assessment & Plan Note (Signed)
Sore throat x4-5 days after son home from pre-school with URI symptoms.  Initial exudate observed by patient has resolved. Given beefy red appearance of posterior oropharynx, will swab for strep today.  Also swab for COVID-19 given other symptoms.  Strep negative.

## 2021-06-06 ENCOUNTER — Telehealth (HOSPITAL_BASED_OUTPATIENT_CLINIC_OR_DEPARTMENT_OTHER): Payer: Self-pay

## 2021-06-06 LAB — NOVEL CORONAVIRUS, NAA

## 2021-06-06 NOTE — Telephone Encounter (Signed)
Received a call form [facility] that the sample was received at room temp and needed to be either refrigerated or frozen Unable to use swab for Covid 19 Please advise on how to proceed

## 2021-06-07 ENCOUNTER — Encounter (HOSPITAL_BASED_OUTPATIENT_CLINIC_OR_DEPARTMENT_OTHER): Payer: Self-pay | Admitting: Nurse Practitioner

## 2021-06-12 ENCOUNTER — Encounter (HOSPITAL_BASED_OUTPATIENT_CLINIC_OR_DEPARTMENT_OTHER): Payer: Self-pay | Admitting: Nurse Practitioner

## 2021-06-12 ENCOUNTER — Other Ambulatory Visit (HOSPITAL_BASED_OUTPATIENT_CLINIC_OR_DEPARTMENT_OTHER): Payer: Self-pay | Admitting: Nurse Practitioner

## 2021-06-12 DIAGNOSIS — J0110 Acute frontal sinusitis, unspecified: Secondary | ICD-10-CM

## 2021-06-12 MED ORDER — AMOXICILLIN-POT CLAVULANATE 875-125 MG PO TABS: 1.0000 | ORAL_TABLET | Freq: Two times a day (BID) | ORAL | 0 refills | Status: DC

## 2021-08-23 DIAGNOSIS — D259 Leiomyoma of uterus, unspecified: Secondary | ICD-10-CM | POA: Insufficient documentation

## 2021-11-07 ENCOUNTER — Other Ambulatory Visit: Payer: Self-pay

## 2021-11-07 ENCOUNTER — Ambulatory Visit (INDEPENDENT_AMBULATORY_CARE_PROVIDER_SITE_OTHER): Payer: BC Managed Care – PPO | Admitting: Nurse Practitioner

## 2021-11-07 ENCOUNTER — Encounter (HOSPITAL_BASED_OUTPATIENT_CLINIC_OR_DEPARTMENT_OTHER): Payer: Self-pay | Admitting: Nurse Practitioner

## 2021-11-07 DIAGNOSIS — R300 Dysuria: Secondary | ICD-10-CM | POA: Diagnosis not present

## 2021-11-07 DIAGNOSIS — J0140 Acute pansinusitis, unspecified: Secondary | ICD-10-CM

## 2021-11-07 MED ORDER — AMOXICILLIN-POT CLAVULANATE 875-125 MG PO TABS: 1.0000 | ORAL_TABLET | Freq: Two times a day (BID) | ORAL | 0 refills | Status: DC

## 2021-11-07 NOTE — Patient Instructions (Signed)
I want you to keep drinking lots of water to help stay hydrated. Rest as much as you can.  I have sent in Augmentin for you- you will take this twice a day for 5 days. If you do not feel any better towards the end of the week, please let me know.

## 2021-11-07 NOTE — Progress Notes (Signed)
Acute Office Visit  Subjective:    Patient ID: Destiny Keith, female    DOB: 1986-03-18, 36 y.o.   MRN: [id number]  No chief complaint on file.   HPI Patient is in today for concerns with dysuria and URI symptoms.  Patient reports she initially made appointment for discussion of dysuria that started over the weekend with no hematuria present. She endorses lower abdominal tenderness and increased urination was also present. She tells me she increased her water intake and her symptoms have now resolved.   She reports that more concerning today are symptoms of sinus pain/pressure, congestion, sore throat, subjective fevers, and fatigue. She denies cough, ear pain, difficulty swallowing, N/V/D.  She reports her symptoms have been ongoing for several days, but seem to be worsening. Her husband and daughter also have symptoms, but they appear to be milder.  She is actively trying to conceive and is in her ovulation cycle at this time.   Past Medical History:  Diagnosis Date   Genital warts    Headache    Ovarian cyst     Past Surgical History:  Procedure Laterality Date   NO PAST SURGERIES      Family History  Problem Relation Age of Onset   Diabetes Maternal Grandmother    Hypertension Maternal Grandmother    Thyroid disease Maternal Grandmother     Social History   Socioeconomic History   Marital status: Married    Spouse name: Not on file   Number of children: Not on file   Years of education: Not on file   Highest education level: Not on file  Occupational History   Not on file  Tobacco Use   Smoking status: Never   Smokeless tobacco: Never  Vaping Use   Vaping Use: Never used  Substance and Sexual Activity   Alcohol use: Not Currently   Drug use: Never   Sexual activity: Yes  Other Topics Concern   Not on file  Social History Narrative   Not on file   Social Determinants of Health   Financial Resource Strain: Not on file  Food Insecurity: Not on  file  Transportation Needs: Not on file  Physical Activity: Not on file  Stress: Not on file  Social Connections: Not on file  Intimate Partner Violence: Not on file    Outpatient Medications Prior to Visit  Medication Sig Dispense Refill   acetaminophen (TYLENOL) 325 MG tablet Take 650 mg by mouth every 6 (six) hours as needed for mild pain.     aspirin-acetaminophen-caffeine (EXCEDRIN MIGRAINE) 250-250-65 MG tablet Take by mouth every 6 (six) hours as needed for headache.     estradiol (ESTRACE) 2 MG tablet Take 2 mg by mouth daily.     medroxyPROGESTERone (PROVERA) 10 MG tablet TAKE 1 TABLET (10 MG TOTAL) BY MOUTH DAILY. FOR 60 DAYS. 50 tablet 1   amoxicillin-clavulanate (AUGMENTIN) 875-125 MG tablet Take 1 tablet by mouth 2 (two) times daily. 10 tablet 0   No facility-administered medications prior to visit.    No Known Allergies  Review of Systems All review of systems negative except what is listed in the HPI     Objective:    Physical Exam Vitals and nursing note reviewed.  Constitutional:      Appearance: She is ill-appearing.  HENT:     Head: Normocephalic.     Nose: Congestion and rhinorrhea present.     Right Sinus: Maxillary sinus tenderness and frontal sinus tenderness present.  Left Sinus: Maxillary sinus tenderness and frontal sinus tenderness present.     Mouth/Throat:     Pharynx: Oropharyngeal exudate and posterior oropharyngeal erythema present.     Tonsils: Tonsillar exudate present.  Eyes:     Extraocular Movements: Extraocular movements intact.     Conjunctiva/sclera: Conjunctivae normal.     Pupils: Pupils are equal, round, and reactive to light.  Neck:     Vascular: No carotid bruit.  Cardiovascular:     Rate and Rhythm: Normal rate and regular rhythm.     Pulses: Normal pulses.     Heart sounds: Normal heart sounds.  Pulmonary:     Effort: Pulmonary effort is normal.     Breath sounds: Normal breath sounds. No wheezing or rhonchi.   Abdominal:     General: Bowel sounds are normal. There is no distension.     Palpations: Abdomen is soft.     Tenderness: There is no abdominal tenderness. There is no right CVA tenderness, left CVA tenderness or guarding.  Musculoskeletal:     Right lower leg: No edema.     Left lower leg: No edema.  Lymphadenopathy:     Cervical: Cervical adenopathy present.  Skin:    General: Skin is warm and dry.     Capillary Refill: Capillary refill takes less than 2 seconds.  Neurological:     General: No focal deficit present.     Mental Status: She is alert and oriented to person, place, and time.  Psychiatric:        Mood and Affect: Mood normal.        Behavior: Behavior normal.        Thought Content: Thought content normal.        Judgment: Judgment normal.    There were no vitals taken for this visit. Wt Readings from Last 3 Encounters:  06/05/21 155 lb 12.8 oz (70.7 kg)  02/16/21 153 lb 12.8 oz (69.8 kg)  03/11/20 189 lb 9.5 oz (86 kg)    Health Maintenance Due  Topic Date Due   Hepatitis C Screening  Never done   PAP SMEAR-Modifier  Never done   COVID-19 Vaccine (3 - Booster for Pfizer series) 07/12/2020   INFLUENZA VACCINE  04/17/2021    There are no preventive care reminders to display for this patient.   No results found for: TSH Lab Results  Component Value Date   WBC 9.3 03/12/2020   HGB 11.0 (L) 03/12/2020   HCT 32.7 (L) 03/12/2020   MCV 91.6 03/12/2020   PLT 206 03/12/2020   No results found for: NA, K, CHLORIDE, CO2, GLUCOSE, BUN, CREATININE, BILITOT, ALKPHOS, AST, ALT, PROT, ALBUMIN, CALCIUM, ANIONGAP, EGFR, GFR No results found for: CHOL No results found for: HDL No results found for: LDLCALC No results found for: TRIG No results found for: CHOLHDL No results found for: HGBA1C     Assessment & Plan:   Problem List Items Addressed This Visit   None Visit Diagnoses     Acute non-recurrent pansinusitis    -  Primary   Relevant Medications    amoxicillin-clavulanate (AUGMENTIN) 875-125 MG tablet   Dysuria       Relevant Medications   amoxicillin-clavulanate (AUGMENTIN) 875-125 MG tablet        Meds ordered this encounter  Medications   amoxicillin-clavulanate (AUGMENTIN) 875-125 MG tablet    Sig: Take 1 tablet by mouth 2 (two) times daily.    Dispense:  10 tablet    Refill:  0     Orma Render, NP

## 2021-11-09 DIAGNOSIS — R300 Dysuria: Secondary | ICD-10-CM | POA: Insufficient documentation

## 2021-11-09 DIAGNOSIS — J0140 Acute pansinusitis, unspecified: Secondary | ICD-10-CM | POA: Insufficient documentation

## 2021-11-09 NOTE — Assessment & Plan Note (Signed)
Symptoms and presentation consistent with acute sinusitis.  Will send treatment today with augmentin for 5 days.  Recommend flonase and mucinex to help with inflammation and increased mucous production. Tylenol may be used for pain.  F/U if sx worsen or fail to improve.

## 2021-11-09 NOTE — Assessment & Plan Note (Signed)
Symptoms have resolved with increased fluids.  We are treating sinusitis today, therefore antibiotic selection should also cover any bacteria that may be residually present.  Recommend continue increased water intake and f/u if sx worsen or fail to improve.

## 2021-12-17 ENCOUNTER — Telehealth: Payer: BC Managed Care – PPO | Admitting: Nurse Practitioner

## 2021-12-17 DIAGNOSIS — J029 Acute pharyngitis, unspecified: Secondary | ICD-10-CM | POA: Diagnosis not present

## 2021-12-17 MED ORDER — AMOXICILLIN 875 MG PO TABS: 875.0000 mg | ORAL_TABLET | Freq: Two times a day (BID) | ORAL | 0 refills | Status: DC

## 2021-12-17 NOTE — Progress Notes (Signed)
? ?Virtual Visit Consent  ? ?Destiny Keith, you are scheduled for a virtual visit with Destiny Keith, [place], a [facility] provider, today.   ?  ?Just as with appointments in the office, your consent must be obtained to participate.  Your consent will be active for this visit and any virtual visit you may have with one of our providers in the next 365 days.   ?  ?If you have a MyChart account, a copy of this consent can be sent to you electronically.  All virtual visits are billed to your insurance company just like a traditional visit in the office.   ? ?As this is a virtual visit, video technology does not allow for your provider to perform a traditional examination.  This may limit your provider's ability to fully assess your condition.  If your provider identifies any concerns that need to be evaluated in person or the need to arrange testing (such as labs, EKG, etc.), we will make arrangements to do so.   ?  ?Although advances in technology are sophisticated, we cannot ensure that it will always work on either your end or our end.  If the connection with a video visit is poor, the visit may have to be switched to a telephone visit.  With either a video or telephone visit, we are not always able to ensure that we have a secure connection.    ? ?I need to obtain your verbal consent now.   Are you willing to proceed with your visit today? YES ?  ?Ameliana Brashear has provided verbal consent on 12/17/2021 for a virtual visit (video or telephone). ?  ?Destiny Hassell Done, FNP  ? ?Date: 12/17/2021 10:23 AM ? ? ?Virtual Visit via Video Note  ? ?I, Destiny Keith, connected with Destiny Keith ([id number], January 14, 1986) on 12/17/21 at 10:30 AM EDT by a video-enabled telemedicine application and verified that I am speaking with the correct person using two identifiers. ? ?Location: ?Patient: Virtual Visit Location Patient: Home ?Provider: Virtual Visit Location Provider: Mobile ?  ?I discussed the  limitations of evaluation and management by telemedicine and the availability of in person appointments. The patient expressed understanding and agreed to proceed.   ? ?History of Present Illness: ?Destiny Keith is a 36 y.o. who identifies as a female who was assigned female at birth, and is being seen today for sore throat. ? ?HPI: Sore Throat  ?This is a new problem. Episode onset: 2days ago. The problem has been gradually worsening. Neither side of throat is experiencing more pain than the other. The maximum temperature recorded prior to her arrival was 100.4 - 100.9 F. The fever has been present for Less than 1 day. The pain is at a severity of 5/10. The pain is mild. Associated symptoms include congestion, coughing, ear pain and swollen glands. She has had no exposure to strep. She has tried acetaminophen for the symptoms. The treatment provided mild relief.  Her 34 year old is currently being treated with antibiotic. ?Review of Systems  ?HENT:  Positive for congestion and ear pain.   ?Respiratory:  Positive for cough.   ? ?Problems:  ?Patient Active Problem List  ? Diagnosis Date Noted  ? Acute non-recurrent pansinusitis 11/09/2021  ? Dysuria 11/09/2021  ? Uterine leiomyoma 08/23/2021  ? Abnormal uterine bleeding 03/08/2021  ? Hemorrhoid 02/16/2021  ? Chronic idiopathic constipation 02/16/2021  ? Increased liver enzymes 02/16/2021  ? Menstrual cycle disorder 02/16/2021  ? Body mass index 28.0-28.9, adult 02/16/2021  ?  ANA positive 02/16/2021  ? Genital warts 07/29/2019  ? Elevated prolactin level 01/25/2018  ? Migraine 09/04/2017  ?  ?Allergies: No Known Allergies ?Medications:  ?Current Outpatient Medications:  ?  acetaminophen (TYLENOL) 325 MG tablet, Take 650 mg by mouth every 6 (six) hours as needed for mild pain., Disp: , Rfl:  ?  amoxicillin-clavulanate (AUGMENTIN) 875-125 MG tablet, Take 1 tablet by mouth 2 (two) times daily., Disp: 10 tablet, Rfl: 0 ?  aspirin-acetaminophen-caffeine (EXCEDRIN  MIGRAINE) 250-250-65 MG tablet, Take by mouth every 6 (six) hours as needed for headache., Disp: , Rfl:  ?  estradiol (ESTRACE) 2 MG tablet, Take 2 mg by mouth daily., Disp: , Rfl:  ?  medroxyPROGESTERone (PROVERA) 10 MG tablet, TAKE 1 TABLET (10 MG TOTAL) BY MOUTH DAILY. FOR 60 DAYS., Disp: 50 tablet, Rfl: 1 ? ?Observations/Objective: ?Patient is well-developed, well-nourished in no acute distress.  ?Resting comfortably  at home.  ?Head is normocephalic, atraumatic.  ?No labored breathing.  ?Speech is clear and coherent with logical content.  ?Patient is alert and oriented at baseline.  ? ? ?Assessment and Plan: ? ?Destiny Keith in today with chief complaint of Sore Throat ? ? ?1. Pharyngitis, unspecified etiology ?Force fluids ?Motrin or tylenol OTC ?OTC decongestant ?Throat lozenges if help ?New toothbrush in 3 days ? ?- amoxicillin (AMOXIL) 875 MG tablet; Take 1 tablet (875 mg total) by mouth 2 (two) times daily. 1 po BID  Dispense: 20 tablet; Refill: 0 ? ? ? ? ?Follow Up Instructions: ?I discussed the assessment and treatment plan with the patient. The patient was provided an opportunity to ask questions and all were answered. The patient agreed with the plan and demonstrated an understanding of the instructions.  A copy of instructions were sent to the patient via MyChart. ? ?The patient was advised to call back or seek an in-person evaluation if the symptoms worsen or if the condition fails to improve as anticipated. ? ?Time:  ?I spent 15 minutes with the patient via telehealth technology discussing the above problems/concerns.   ? ?Destiny Hassell Done, FNP ? ?

## 2021-12-17 NOTE — Patient Instructions (Signed)
Force fluids °Motrin or tylenol OTC °OTC decongestant °Throat lozenges if help °New toothbrush in 3 days ° °

## 2022-01-02 ENCOUNTER — Other Ambulatory Visit (HOSPITAL_BASED_OUTPATIENT_CLINIC_OR_DEPARTMENT_OTHER): Payer: Self-pay

## 2022-01-02 ENCOUNTER — Encounter (HOSPITAL_BASED_OUTPATIENT_CLINIC_OR_DEPARTMENT_OTHER): Payer: Self-pay | Admitting: Nurse Practitioner

## 2022-01-02 ENCOUNTER — Ambulatory Visit (INDEPENDENT_AMBULATORY_CARE_PROVIDER_SITE_OTHER): Payer: BC Managed Care – PPO | Admitting: Nurse Practitioner

## 2022-01-02 VITALS — BP 128/88 | HR 86 | Temp 101.2°F | Ht 62.0 in | Wt 156.0 lb

## 2022-01-02 DIAGNOSIS — J0141 Acute recurrent pansinusitis: Secondary | ICD-10-CM | POA: Diagnosis not present

## 2022-01-02 DIAGNOSIS — J029 Acute pharyngitis, unspecified: Secondary | ICD-10-CM | POA: Diagnosis not present

## 2022-01-02 DIAGNOSIS — J020 Streptococcal pharyngitis: Secondary | ICD-10-CM

## 2022-01-02 LAB — POCT RAPID STREP A (OFFICE): Rapid Strep A Screen: POSITIVE — AB

## 2022-01-02 MED ORDER — PENICILLIN G BENZATHINE 1200000 UNIT/2ML IM SUSY: 1.2000 10*6.[IU] | PREFILLED_SYRINGE | Freq: Once | INTRAMUSCULAR | Status: DC

## 2022-01-02 MED ORDER — FLUTICASONE PROPIONATE 50 MCG/ACT NA SUSP
2.0000 | Freq: Every day | NASAL | 6 refills | Status: DC
  Filled 2022-01-02: qty 16, 25d supply, fill #0

## 2022-01-02 MED ORDER — PENICILLIN G BENZATHINE 1200000 UNIT/2ML IM SUSY
2.4000 10*6.[IU] | PREFILLED_SYRINGE | Freq: Once | INTRAMUSCULAR | Status: AC
  Administered 2022-01-02: 2.4 10*6.[IU] via INTRAMUSCULAR

## 2022-01-02 MED ORDER — FLUCONAZOLE 150 MG PO TABS
ORAL_TABLET | ORAL | 2 refills | Status: DC
  Filled 2022-01-02: qty 2, 3d supply, fill #0

## 2022-01-02 MED ORDER — CEFDINIR 300 MG PO CAPS
300.0000 mg | ORAL_CAPSULE | Freq: Two times a day (BID) | ORAL | 0 refills | Status: DC
Start: 2022-01-02 — End: 2022-03-26
  Filled 2022-01-02: qty 14, 7d supply, fill #0

## 2022-01-02 NOTE — Patient Instructions (Addendum)
It was a pleasure seeing you today. I hope your time spent with [place] was pleasant and helpful. Please let us know if there is anything we can do to improve the service you receive.  ? ?Today we discussed concerns with: ? ?Sore throat ?Strep pharyngitis ? ?I am sending in cefdinir for you and we gave you a shot of penicillin in the office today. I have also sent in prednisone for you to help reduce the swelling in your ears and sinuses.  ? ?The following orders have been placed for you today: ? ?Orders Placed This Encounter  ?Procedures  ? POCT rapid strep A  ? ? ? ?Important Office Information ?Lab Results ?If labs were ordered, please note that you will see results through [place] as soon as they come available from [place].  ?It takes up to 5 business days for the results to be routed to me and for me to review them once all of the lab results have come through from [facility]. I will make recommendations based on your results and send these through [place] or someone from the office will call you to discuss. If your labs are abnormal, we may contact you to schedule a visit to discuss the results and make recommendations.  ?If you have not heard from [place] within 5 business days or you have waited longer than a week and your lab results have not come through on [place], please feel free to call the office or send a message through [place] to follow-up on these labs.  ? ?Referrals ?If referrals were placed today, the office where the referral was sent will contact you either by phone or through [place] to set up scheduling. Please note that it can take up to a week for the referral office to contact you. If you do not hear from them in a week, please contact the referral office directly to inquire about scheduling.  ? ?Condition Treated ?If your condition worsens or you begin to have new symptoms, please schedule a follow-up appointment for further evaluation. If you are not sure if an appointment is needed, you may call  the office to leave a message for the nurse and someone will contact you with recommendations.  ?If you have an urgent or life threatening emergency, please do not call the office, but seek emergency evaluation by calling 911 or going to the nearest emergency room for evaluation.  ? ?MyChart and Phone Calls ?Please do not use MyChart for urgent messages. It may take up to 3 business days for MyChart messages to be read by staff and if they are unable to handle the request, an additional 3 business days for them to be routed to me and for my response.  ?Messages sent to the provider through [place] do not come directly to the provider, please allow time for these messages to be routed and for me to respond.  ?We get a large volume of MyChart messages daily and these are responded to in the order received.  ? ?For urgent messages, please call the office at [phone] and speak with the front office staff or leave a message on the line of my assistant for guidance.  ?We are seeing patients from the hours of 8:00 am through 5:00 pm and calls directly to the nurse may not be answered immediately due to seeing patients, but your call will be returned as soon as possible.  ?Phone  messages received after 4:00 PM Monday through Thursday may not be returned  until the following business day. Phone messages received after 11:00 AM on Friday may not be returned until Monday.  ? ?After Hours ?We share on call hours with providers from other offices. If you have an urgent need after hours that cannot wait until the next business day, please contact the on call provider by calling the office number. A nurse will speak with you and contact the provider if needed for recommendations.  ?If you have an urgent or life threatening emergency after hours, please do not call the on call provider, but seek emergency evaluation by calling 911 or going to the nearest emergency room for evaluation.  ? ?Paperwork ?All paperwork requires a  minimum of 5 days to complete and return to you or the designated personnel. Please keep this in mind when bringing in forms or sending requests for paperwork completion to the office.  ?  ?

## 2022-01-02 NOTE — Progress Notes (Signed)
? ?Acute Office Visit ? ?Subjective:  ? ?  ?Patient ID: Destiny Keith, female    DOB: [birth year], 36 y.o.   MRN: [id number] ? ?Chief Complaint  ?Patient presents with  ? Sore Throat  ? ? ?HPI ?Patient is in today for: ? ?- Sore throat, tonsillar edema, nasal congestion with drainage, bilat ear ache and pressure, facial sinus pain, tension-like headache, fever,  ? - Strep(+) in office ? - Onset ~ Saturday  ? - Denies cough, fatigue, body aches  ? - Tried Sudafed, [employer] allergy relief ?- States had virtual visit and given Amoxicillin, completed 10 day tx last week ? - Flying this Friday concerned about ears ?- she and her spouse are actively trying to conceive. Her LMP was three weeks ago, she has had unprotected intercourse since her LMP.  ? ?ROS ?All review of systems negative except what is listed in the HPI ? ?   ?Objective:  ?  ?BP 128/88   Pulse 86   Temp (!) 101.2 ?F (38.4 ?C)   Ht '5\' 2"'$  (1.575 m)   Wt 156 lb (70.8 kg)   SpO2 97%   BMI 28.53 kg/m?  ? ?Physical Exam ?Vitals and nursing note reviewed.  ?Constitutional:   ?   Appearance: She is well-developed. She is ill-appearing.  ?HENT:  ?   Head: Normocephalic.  ?   Ears:  ?   Comments: Bilateral effusion present with erythema and bulging TM. No drainage present. TM intact.  ?   Nose: Congestion and rhinorrhea present.  ?   Mouth/Throat:  ?   Mouth: Mucous membranes are moist.  ?   Pharynx: Pharyngeal swelling, oropharyngeal exudate and posterior oropharyngeal erythema present.  ?   Tonsils: Tonsillar exudate present. 2+ on the right. 2+ on the left.  ?Eyes:  ?   Conjunctiva/sclera: Conjunctivae normal.  ?   Pupils: Pupils are equal, round, and reactive to light.  ?Cardiovascular:  ?   Rate and Rhythm: Normal rate and regular rhythm.  ?   Heart sounds: Normal heart sounds.  ?Pulmonary:  ?   Effort: Pulmonary effort is normal.  ?   Breath sounds: Normal breath sounds. No wheezing.  ?Abdominal:  ?   General: Bowel sounds are normal.   ?Lymphadenopathy:  ?   Cervical: Cervical adenopathy present.  ?Skin: ?   General: Skin is warm and dry.  ?   Capillary Refill: Capillary refill takes less than 2 seconds.  ?Neurological:  ?   Mental Status: She is alert.  ?Psychiatric:     ?   Mood and Affect: Mood normal.     ?   Behavior: Behavior normal.  ? ? ?Results for orders placed or performed in visit on 01/02/22  ?POCT rapid strep A  ?Result Value Ref Range  ? Rapid Strep A Screen Positive (A) Negative  ? ? ? ?   ?Assessment & Plan:  ? ?Problem List Items Addressed This Visit   ? ? Strep pharyngitis - Primary  ?  Symptoms and presentation consistent with strep pharyngitis with sinus involvement. + strep test in office today. She has recently completed augmentin treatment. Unclear if infection was present at the time she was on antibiotics. We have seen several cases of strep this year that fail initial antibiotic treatment with augmentin, which could be the case. Will initiate treatment today with penicillin IM to get medication into system quickly as she is flying in the next few days and we want to get the symptoms  under better control. Will avoid use of steroids as these would not be recommended due to possible Camelia Stelzner pregnancy. Will send treatment with oral cefdinir to start tomorrow and fluticasone nasal spray. Monitor closely for new or worsening symptoms or lack of improvement with completion of antibiotics and notify immediately.  ? ?  ?  ? Relevant Medications  ? cefdinir (OMNICEF) 300 MG capsule  ? fluconazole (DIFLUCAN) 150 MG tablet  ? fluticasone (FLONASE) 50 MCG/ACT nasal spray  ? ?Other Visit Diagnoses   ? ? Sore throat      ? Relevant Orders  ? POCT rapid strep A (Completed)  ? Acute recurrent pansinusitis      ? Relevant Medications  ? cefdinir (OMNICEF) 300 MG capsule  ? fluconazole (DIFLUCAN) 150 MG tablet  ? fluticasone (FLONASE) 50 MCG/ACT nasal spray  ? penicillin g benzathine (BICILLIN LA) 1200000 UNIT/2ML injection 2.4 Million  Units (Completed)  ? ?  ? ? ?Meds ordered this encounter  ?Medications  ? cefdinir (OMNICEF) 300 MG capsule  ?  Sig: Take 1 capsule (300 mg total) by mouth 2 (two) times daily.  ?  Dispense:  14 capsule  ?  Refill:  0  ? fluconazole (DIFLUCAN) 150 MG tablet  ?  Sig: Take 1 tablet by mouth at the first sign of symptoms of yeast. If no resolution, repeat dose in 72 hours.  ?  Dispense:  2 tablet  ?  Refill:  2  ? fluticasone (FLONASE) 50 MCG/ACT nasal spray  ?  Sig: Place 2 sprays into both nostrils daily.  ?  Dispense:  16 g  ?  Refill:  6  ? DISCONTD: penicillin g benzathine (BICILLIN LA) 1200000 UNIT/2ML injection 1.2 Million Units  ?  Order Specific Question:   Antibiotic Indication:  ?  Answer:   Pharyngitis  ? penicillin g benzathine (BICILLIN LA) 1200000 UNIT/2ML injection 2.4 Million Units  ?  Order Specific Question:   Antibiotic Indication:  ?  Answer:   Group B Strep Prophylaxis  ?  Order Specific Question:   Other Indication:  ?  Answer:   1.2 given  ? ? ?Return if symptoms worsen or fail to improve. ? ?Orma Render, NP ? ?  ?Patient seen along with NP student, Ozella Almond at today's visit. Portions of documentation and assessment have been completed by the student listed.  ?I, Orma Render, NP, have reviewed all documentation completed by the student. The documentation on 01/13/22 for the exam, diagnosis, procedures, and orders are all accurate and complete. ? ?I, Orma Render, Julious Oka personally seen and evaluated the patient during this encounter.  ?While the patient was in clinic, I reviewed the patient?s medical history, the student's findings on physical examination, and the patient?s diagnosis and treatment plan. All aspects of care were discussed with the the student and I agree with the information documented.  ? ?Anelis Hrivnak, Coralee Pesa, NP, DNp, AGNP-c ?Primary Care & Sports Medicine at [facility], [place] ?[facility]  ? ? ?

## 2022-01-13 DIAGNOSIS — J020 Streptococcal pharyngitis: Secondary | ICD-10-CM | POA: Insufficient documentation

## 2022-01-13 NOTE — Assessment & Plan Note (Signed)
Symptoms and presentation consistent with strep pharyngitis with sinus involvement. + strep test in office today. She has recently completed augmentin treatment. Unclear if infection was present at the time she was on antibiotics. We have seen several cases of strep this year that fail initial antibiotic treatment with augmentin, which could be the case. Will initiate treatment today with penicillin IM to get medication into system quickly as she is flying in the next few days and we want to get the symptoms under better control. Will avoid use of steroids as these would not be recommended due to possible Sonni Barse pregnancy. Will send treatment with oral cefdinir to start tomorrow and fluticasone nasal spray. Monitor closely for new or worsening symptoms or lack of improvement with completion of antibiotics and notify immediately.  ?

## 2022-01-16 DIAGNOSIS — N920 Excessive and frequent menstruation with regular cycle: Secondary | ICD-10-CM | POA: Diagnosis not present

## 2022-01-16 DIAGNOSIS — N939 Abnormal uterine and vaginal bleeding, unspecified: Secondary | ICD-10-CM | POA: Diagnosis not present

## 2022-01-22 DIAGNOSIS — N924 Excessive bleeding in the premenopausal period: Secondary | ICD-10-CM | POA: Diagnosis not present

## 2022-01-22 DIAGNOSIS — N920 Excessive and frequent menstruation with regular cycle: Secondary | ICD-10-CM | POA: Diagnosis not present

## 2022-02-08 ENCOUNTER — Other Ambulatory Visit: Payer: Self-pay | Admitting: Obstetrics and Gynecology

## 2022-02-08 DIAGNOSIS — N979 Female infertility, unspecified: Secondary | ICD-10-CM

## 2022-02-15 ENCOUNTER — Ambulatory Visit
Admission: RE | Admit: 2022-02-15 | Discharge: 2022-02-15 | Disposition: A | Discharge status: Home / Self Care | Payer: BC Managed Care – PPO | Source: Ambulatory Visit | Attending: Obstetrics and Gynecology | Admitting: Obstetrics and Gynecology

## 2022-02-15 DIAGNOSIS — N979 Female infertility, unspecified: Secondary | ICD-10-CM

## 2022-03-25 ENCOUNTER — Encounter (HOSPITAL_COMMUNITY): Payer: Self-pay

## 2022-03-25 ENCOUNTER — Inpatient Hospital Stay (HOSPITAL_COMMUNITY)
Admission: AD | Admit: 2022-03-25 | Discharge: 2022-03-26 | Disposition: A | Discharge status: Home / Self Care | Payer: BC Managed Care – PPO | Attending: Obstetrics and Gynecology | Admitting: Obstetrics and Gynecology

## 2022-03-25 DIAGNOSIS — Z3491 Encounter for supervision of normal pregnancy, unspecified, first trimester: Secondary | ICD-10-CM

## 2022-03-25 DIAGNOSIS — K5900 Constipation, unspecified: Secondary | ICD-10-CM

## 2022-03-25 DIAGNOSIS — O99611 Diseases of the digestive system complicating pregnancy, first trimester: Secondary | ICD-10-CM | POA: Diagnosis not present

## 2022-03-25 DIAGNOSIS — Z3201 Encounter for pregnancy test, result positive: Secondary | ICD-10-CM | POA: Diagnosis not present

## 2022-03-25 DIAGNOSIS — O26891 Other specified pregnancy related conditions, first trimester: Secondary | ICD-10-CM | POA: Insufficient documentation

## 2022-03-25 DIAGNOSIS — O219 Vomiting of pregnancy, unspecified: Secondary | ICD-10-CM

## 2022-03-25 DIAGNOSIS — Z3A01 Less than 8 weeks gestation of pregnancy: Secondary | ICD-10-CM | POA: Diagnosis not present

## 2022-03-25 DIAGNOSIS — R109 Unspecified abdominal pain: Secondary | ICD-10-CM | POA: Diagnosis not present

## 2022-03-25 LAB — URINALYSIS, ROUTINE W REFLEX MICROSCOPIC
Bilirubin Urine: NEGATIVE
Glucose, UA: NEGATIVE mg/dL
Hgb urine dipstick: NEGATIVE
Ketones, ur: 5 mg/dL — AB
Leukocytes,Ua: NEGATIVE
Nitrite: NEGATIVE
Protein, ur: NEGATIVE mg/dL
Specific Gravity, Urine: 1.004 — ABNORMAL LOW (ref 1.005–1.030)
pH: 8 (ref 5.0–8.0)

## 2022-03-25 LAB — POCT PREGNANCY, URINE: Preg Test, Ur: POSITIVE — AB

## 2022-03-25 MED ORDER — METOCLOPRAMIDE HCL 5 MG/ML IJ SOLN
10.0000 mg | Freq: Once | INTRAMUSCULAR | Status: AC
  Administered 2022-03-26: 10 mg via INTRAVENOUS
  Filled 2022-03-25: qty 2

## 2022-03-25 MED ORDER — FAMOTIDINE IN NACL 20-0.9 MG/50ML-% IV SOLN
20.0000 mg | Freq: Once | INTRAVENOUS | Status: AC
  Administered 2022-03-25: 20 mg via INTRAVENOUS
  Filled 2022-03-25: qty 50

## 2022-03-25 MED ORDER — LACTATED RINGERS IV BOLUS
1000.0000 mL | Freq: Once | INTRAVENOUS | Status: AC
  Administered 2022-03-25: 1000 mL via INTRAVENOUS

## 2022-03-25 NOTE — MAU Note (Signed)
.  Destiny Keith is a 36 y.o. at Unknown here in MAU reporting: July 4th nausea began-then she started vomiting every 2-3 hours.  Pt states she is very week. Also reports she has been severely constipated and thinks that may be causing the burning pain and bloat. Also repots feel she must take a deep breath to get enough oxygen.   Onset of complaint: 03/20/2022 Pain score: 7 for LUQ Vitals:   03/25/22 2147  BP: 124/71  Pulse: 81  Resp: 17  Temp: 98.4 F (36.9 C)  SpO2: 100%      Lab orders placed from triage:  UA

## 2022-03-25 NOTE — MAU Provider Note (Signed)
History     [id number]  Arrival date and time: 03/25/22 1809    Chief Complaint  Patient presents with   Weakness   Emesis During Pregnancy   Abdominal Pain     HPI Destiny Keith is a 36 y.o. at 44w3dwho presents for n/v & abdominal pain. Reports daily nausea & vomiting since 7/4. Was started on unisom last week but states it hasn't helped. Reports vomiting every 2-3 hours. Reports abdominal bloating since yesterday & endorses pain throughout the left side of her abdomen. Last BM was a week ago. Has been taking fiber supplements. States she's not eating as much due to nausea.  Denies vaginal bleeding. Goes to [facility] but hasn't had any imaging yet with this pregnancy.    OB History     Gravida  4   Para  1   Term  1   Preterm      AB  2   Living  1      SAB  1   IAB  1   Ectopic      Multiple  0   Live Births  1           Past Medical History:  Diagnosis Date   Genital warts    Headache    Ovarian cyst     Past Surgical History:  Procedure Laterality Date   NO PAST SURGERIES      Family History  Problem Relation Age of Onset   Diabetes Maternal Grandmother    Hypertension Maternal Grandmother    Thyroid disease Maternal Grandmother     No Known Allergies  No current facility-administered medications on file prior to encounter.   Current Outpatient Medications on File Prior to Encounter  Medication Sig Dispense Refill   acetaminophen (TYLENOL) 325 MG tablet Take 650 mg by mouth every 6 (six) hours as needed for mild pain. (Patient not taking: Reported on 01/02/2022)     aspirin-acetaminophen-caffeine (EXCEDRIN MIGRAINE) 250-250-65 MG tablet Take by mouth every 6 (six) hours as needed for headache. (Patient not taking: Reported on 01/02/2022)     cefdinir (OMNICEF) 300 MG capsule Take 1 capsule (300 mg total) by mouth 2 (two) times daily. 14 capsule 0   fluconazole (DIFLUCAN) 150 MG tablet Take 1 tablet by mouth at the first  sign of symptoms of yeast. If no resolution, repeat dose in 72 hours. 2 tablet 2   fluticasone (FLONASE) 50 MCG/ACT nasal spray Place 2 sprays into both nostrils daily. 16 g 6     ROS Pertinent positives and negative per HPI, all others reviewed and negative  Physical Exam   BP 124/71 (BP Location: Right Arm)   Pulse 81   Temp 98.4 F (36.9 C) (Oral)   Resp 17   Ht '5\' 2"'$  (1.575 m)   Wt 71.7 kg   LMP 02/08/2022   SpO2 100%   BMI 28.90 kg/m   Patient Vitals for the past 24 hrs:  BP Temp Temp src Pulse Resp SpO2 Height Weight  03/25/22 2147 124/71 98.4 F (36.9 C) Oral 81 17 100 % '5\' 2"'$  (1.575 m) 71.7 kg    Physical Exam Vitals and nursing note reviewed.  Constitutional:      General: She is not in acute distress.    Appearance: She is well-developed.  HENT:     Head: Normocephalic and atraumatic.  Pulmonary:     Effort: Pulmonary effort is normal. No respiratory distress.  Abdominal:  Palpations: Abdomen is soft.     Tenderness: There is no abdominal tenderness. There is no guarding or rebound.  Skin:    General: Skin is warm and dry.  Neurological:     Mental Status: She is alert.       Labs Results for orders placed or performed during the hospital encounter of 03/25/22 (from the past 24 hour(s))  Urinalysis, Routine w reflex microscopic     Status: Abnormal   Collection Time: 03/25/22  6:09 PM  Result Value Ref Range   Color, Urine STRAW (A) YELLOW   APPearance CLEAR CLEAR   Specific Gravity, Urine 1.004 (L) 1.005 - 1.030   pH 8.0 5.0 - 8.0   Glucose, UA NEGATIVE NEGATIVE mg/dL   Hgb urine dipstick NEGATIVE NEGATIVE   Bilirubin Urine NEGATIVE NEGATIVE   Ketones, ur 5 (A) NEGATIVE mg/dL   Protein, ur NEGATIVE NEGATIVE mg/dL   Nitrite NEGATIVE NEGATIVE   Leukocytes,Ua NEGATIVE NEGATIVE  Pregnancy, urine POC     Status: Abnormal   Collection Time: 03/25/22  7:11 PM  Result Value Ref Range   Preg Test, Ur POSITIVE (A) NEGATIVE  CBC     Status:  Abnormal   Collection Time: 03/25/22 11:58 PM  Result Value Ref Range   WBC 9.3 4.0 - 10.5 K/uL   RBC 3.98 3.87 - 5.11 MIL/uL   Hemoglobin 12.3 12.0 - 15.0 g/dL   HCT 33.9 (L) 36.0 - 46.0 %   MCV 85.2 80.0 - 100.0 fL   MCH 30.9 26.0 - 34.0 pg   MCHC 36.3 (H) 30.0 - 36.0 g/dL   RDW 11.9 11.5 - 15.5 %   Platelets 293 150 - 400 K/uL   nRBC 0.0 0.0 - 0.2 %    Imaging US OB Comp Less 14 Wks  Result Date: 03/26/2022 CLINICAL DATA:  Pelvic pain with positive pregnancy test EXAM: OBSTETRIC <14 WK ULTRASOUND TECHNIQUE: Transabdominal ultrasound was performed for evaluation of the gestation as well as the maternal uterus and adnexal regions. COMPARISON:  None Available. FINDINGS: Intrauterine gestational sac: Present Yolk sac:  Present Embryo:  Present Cardiac Activity: Present Heart Rate: 123 bpm CRL:   7.7 mm   6 w 5 d                  [place] EDC: 11/14/2022 Subchorionic hemorrhage:  None visualized. Maternal uterus/adnexae: Ovaries are within normal limits. IMPRESSION: Single live intrauterine gestation at 6 weeks 5 days. No acute abnormality is noted. Electronically Signed   By: Inez Catalina M.D.   On: 03/26/2022 00:52    MAU Course  Procedures Lab Orders         Urinalysis, Routine w reflex microscopic         CBC         hCG, quantitative, pregnancy         Pregnancy, urine POC     Meds ordered this encounter  Medications   lactated ringers bolus 1,000 mL   famotidine (PEPCID) IVPB 20 mg premix   metoCLOPramide (REGLAN) injection 10 mg   metoCLOPramide (REGLAN) 10 MG tablet    Sig: Take 1 tablet (10 mg total) by mouth every 8 (eight) hours as needed for nausea.    Dispense:  30 tablet    Refill:  0    Order Specific Question:   Supervising Provider    Answer:   Lynnda Shields A [[id number]]   famotidine (PEPCID) 20 MG tablet    Sig: Take 1 tablet (  20 mg total) by mouth daily.    Dispense:  30 tablet    Refill:  0    Order Specific Question:   Supervising Provider    Answer:   Lynnda Shields A [[id number]]   Imaging Orders         US OB Comp Less 14 Wks      MDM +UPT UA, wet prep, GC/chlamydia, CBC, ABO/Rh, quant hCG, and [place] today to rule out ectopic pregnancy which can be life threatening.   Labs & ultrasound ordered - results reviewed Ultrasound shows live IUP  Treatment in MAU included IV fluids, reglan & pepcid. Will rx meds.  Assessment and Plan   1. Nausea and vomiting during pregnancy prior to [redacted] weeks gestation  -Rx reglan & pepcid  2. Normal IUP (intrauterine pregnancy) on prenatal ultrasound, first trimester  -Start prenatal care as scheduled  3. Constipation during pregnancy in first trimester  -Reviewed OTC management of constipation  4. [redacted] weeks gestation of pregnancy      Jorje Guild, NP 03/26/22 1:01 AM

## 2022-03-26 ENCOUNTER — Inpatient Hospital Stay (HOSPITAL_COMMUNITY): Payer: BC Managed Care – PPO

## 2022-03-26 DIAGNOSIS — Z3A01 Less than 8 weeks gestation of pregnancy: Secondary | ICD-10-CM | POA: Diagnosis not present

## 2022-03-26 DIAGNOSIS — Z3201 Encounter for pregnancy test, result positive: Secondary | ICD-10-CM | POA: Diagnosis not present

## 2022-03-26 DIAGNOSIS — O99611 Diseases of the digestive system complicating pregnancy, first trimester: Secondary | ICD-10-CM

## 2022-03-26 DIAGNOSIS — O219 Vomiting of pregnancy, unspecified: Secondary | ICD-10-CM

## 2022-03-26 DIAGNOSIS — O26891 Other specified pregnancy related conditions, first trimester: Secondary | ICD-10-CM | POA: Diagnosis not present

## 2022-03-26 DIAGNOSIS — K5900 Constipation, unspecified: Secondary | ICD-10-CM | POA: Diagnosis not present

## 2022-03-26 LAB — CBC
HCT: 33.9 % — ABNORMAL LOW (ref 36.0–46.0)
Hemoglobin: 12.3 g/dL (ref 12.0–15.0)
MCH: 30.9 pg (ref 26.0–34.0)
MCHC: 36.3 g/dL — ABNORMAL HIGH (ref 30.0–36.0)
MCV: 85.2 fL (ref 80.0–100.0)
Platelets: 293 10*3/uL (ref 150–400)
RBC: 3.98 MIL/uL (ref 3.87–5.11)
RDW: 11.9 % (ref 11.5–15.5)
WBC: 9.3 10*3/uL (ref 4.0–10.5)
nRBC: 0 % (ref 0.0–0.2)

## 2022-03-26 LAB — HCG, QUANTITATIVE, PREGNANCY: hCG, Beta Chain, Quant, S: 78398 m[IU]/mL — ABNORMAL HIGH (ref <5)

## 2022-03-26 MED ORDER — FAMOTIDINE 20 MG PO TABS: 20.0000 mg | ORAL_TABLET | Freq: Every day | ORAL | 0 refills | Status: DC

## 2022-03-26 MED ORDER — METOCLOPRAMIDE HCL 10 MG PO TABS: 10.0000 mg | ORAL_TABLET | Freq: Three times a day (TID) | ORAL | 0 refills | Status: DC | PRN

## 2022-03-26 NOTE — MAU Note (Signed)
Pt reports that her nausea has resolved. One episode of emesis prior to medication administration. No further episodes following. Pt states she understands discharge instruction given per Jorje Guild NP

## 2022-03-28 ENCOUNTER — Other Ambulatory Visit (HOSPITAL_BASED_OUTPATIENT_CLINIC_OR_DEPARTMENT_OTHER): Payer: Self-pay | Admitting: Nurse Practitioner

## 2022-03-28 ENCOUNTER — Other Ambulatory Visit: Payer: Self-pay

## 2022-03-28 ENCOUNTER — Encounter (HOSPITAL_BASED_OUTPATIENT_CLINIC_OR_DEPARTMENT_OTHER): Payer: Self-pay

## 2022-03-28 ENCOUNTER — Emergency Department (HOSPITAL_BASED_OUTPATIENT_CLINIC_OR_DEPARTMENT_OTHER)
Admission: EM | Admit: 2022-03-28 | Discharge: 2022-03-28 | Disposition: A | Discharge status: Home / Self Care | Payer: BC Managed Care – PPO | Attending: Emergency Medicine | Admitting: Emergency Medicine

## 2022-03-28 ENCOUNTER — Encounter (HOSPITAL_BASED_OUTPATIENT_CLINIC_OR_DEPARTMENT_OTHER): Payer: Self-pay | Admitting: Nurse Practitioner

## 2022-03-28 DIAGNOSIS — N9489 Other specified conditions associated with female genital organs and menstrual cycle: Secondary | ICD-10-CM | POA: Insufficient documentation

## 2022-03-28 DIAGNOSIS — O219 Vomiting of pregnancy, unspecified: Secondary | ICD-10-CM

## 2022-03-28 DIAGNOSIS — R1013 Epigastric pain: Secondary | ICD-10-CM | POA: Diagnosis not present

## 2022-03-28 DIAGNOSIS — Z3A01 Less than 8 weeks gestation of pregnancy: Secondary | ICD-10-CM | POA: Insufficient documentation

## 2022-03-28 LAB — COMPREHENSIVE METABOLIC PANEL
ALT: 31 U/L (ref 0–44)
AST: 17 U/L (ref 15–41)
Albumin: 4.7 g/dL (ref 3.5–5.0)
Alkaline Phosphatase: 52 U/L (ref 38–126)
Anion gap: 16 — ABNORMAL HIGH (ref 5–15)
BUN: 8 mg/dL (ref 6–20)
CO2: 18 mmol/L — ABNORMAL LOW (ref 22–32)
Calcium: 10.4 mg/dL — ABNORMAL HIGH (ref 8.9–10.3)
Chloride: 102 mmol/L (ref 98–111)
Creatinine, Ser: 0.56 mg/dL (ref 0.44–1.00)
GFR, Estimated: >60 mL/min (ref >60)
Glucose, Bld: 98 mg/dL (ref 70–99)
Potassium: 3.3 mmol/L — ABNORMAL LOW (ref 3.5–5.1)
Sodium: 136 mmol/L (ref 135–145)
Total Bilirubin: 0.6 mg/dL (ref 0.3–1.2)
Total Protein: 7.1 g/dL (ref 6.5–8.1)

## 2022-03-28 LAB — URINALYSIS, ROUTINE W REFLEX MICROSCOPIC
Bilirubin Urine: NEGATIVE
Glucose, UA: NEGATIVE mg/dL
Hgb urine dipstick: NEGATIVE
Ketones, ur: 40 mg/dL — AB
Leukocytes,Ua: NEGATIVE
Nitrite: NEGATIVE
Protein, ur: NEGATIVE mg/dL
Specific Gravity, Urine: 1.015 (ref 1.005–1.030)
pH: 7.5 (ref 5.0–8.0)

## 2022-03-28 LAB — CBC
HCT: 36.5 % (ref 36.0–46.0)
Hemoglobin: 13.3 g/dL (ref 12.0–15.0)
MCH: 31.1 pg (ref 26.0–34.0)
MCHC: 36.4 g/dL — ABNORMAL HIGH (ref 30.0–36.0)
MCV: 85.5 fL (ref 80.0–100.0)
Platelets: 348 10*3/uL (ref 150–400)
RBC: 4.27 MIL/uL (ref 3.87–5.11)
RDW: 12.2 % (ref 11.5–15.5)
WBC: 8.3 10*3/uL (ref 4.0–10.5)
nRBC: 0 % (ref 0.0–0.2)

## 2022-03-28 LAB — HCG, QUANTITATIVE, PREGNANCY: hCG, Beta Chain, Quant, S: 112542 m[IU]/mL — ABNORMAL HIGH (ref <5)

## 2022-03-28 LAB — LIPASE, BLOOD: Lipase: 21 U/L (ref 11–51)

## 2022-03-28 LAB — PREGNANCY, URINE: Preg Test, Ur: POSITIVE — AB

## 2022-03-28 MED ORDER — DOXYLAMINE-PYRIDOXINE 10-10 MG PO TBEC: DELAYED_RELEASE_TABLET | ORAL | 0 refills | Status: DC

## 2022-03-28 MED ORDER — PROMETHAZINE HCL 12.5 MG PO TABS: 12.5000 mg | ORAL_TABLET | Freq: Three times a day (TID) | ORAL | 3 refills | Status: DC | PRN

## 2022-03-28 MED ORDER — METOCLOPRAMIDE HCL 5 MG/ML IJ SOLN
10.0000 mg | Freq: Once | INTRAMUSCULAR | Status: AC
  Administered 2022-03-28: 10 mg via INTRAVENOUS
  Filled 2022-03-28: qty 2

## 2022-03-28 MED ORDER — DEXTROSE-NACL 5-0.45 % IV SOLN: Freq: Once | INTRAVENOUS | Status: AC

## 2022-03-28 MED ORDER — ONDANSETRON 8 MG PO TBDP: 8.0000 mg | ORAL_TABLET | Freq: Three times a day (TID) | ORAL | 3 refills | Status: DC | PRN

## 2022-03-28 MED ORDER — ONDANSETRON HCL 4 MG/2ML IJ SOLN
4.0000 mg | Freq: Once | INTRAMUSCULAR | Status: AC
Start: 2022-03-28 — End: 2022-03-28
  Administered 2022-03-28: 4 mg via INTRAVENOUS
  Filled 2022-03-28: qty 2

## 2022-03-28 MED ORDER — SODIUM CHLORIDE 0.9 % IV SOLN: 25.0000 mg | Freq: Four times a day (QID) | INTRAVENOUS | Status: DC | PRN

## 2022-03-28 NOTE — ED Provider Notes (Signed)
[facility] EMERGENCY DEPT Provider Note   CSN: [id number] Arrival date & time: 03/28/22  1246     History  Chief Complaint  Patient presents with   Emesis During Pregnancy    Destiny Keith is a 36 y.o. female [redacted] weeks pregnant who presents to the ED for evaluation of intractable nausea and vomiting of pregnancy.  Patient was at MAU on 03/25/2022 and was in the emergency department on 03/20/2022.  Initially she was prescribed Unisom which did not seem to help her symptoms.  When she went to MAU, she had IV fluids and was given Reglan and Pepcid.  This does not seem to be helping her anymore.  She is also describing of burning epigastric pain as well.  She has inability to tolerate fluids and is not eating much due to the extreme nausea.  She denies pelvic pain, vaginal bleeding or discharge.  She has her first [facility].  Patient had ultrasound done on 03/25/2022 as well which showed live IUP.  Labs were also reassuring at that time.  She denies chest pain, fevers, chills, diarrhea.  She has been constipated for a few days.  Patient had 1 full-term pregnancy without complications and 2 miscarriages.  HPI     Home Medications Prior to Admission medications   Medication Sig Start Date End Date Taking? Authorizing Provider  Doxylamine-Pyridoxine 10-10 MG TBEC Take 1 tablet on empty stomach with glass of water as needed for nausea and vomiting during pregnancy 03/28/22  Yes Tonye Pearson, PA-C  acetaminophen (TYLENOL) 325 MG tablet Take 650 mg by mouth every 6 (six) hours as needed for mild pain. Patient not taking: Reported on 01/02/2022    [provider]  famotidine (PEPCID) 20 MG tablet Take 1 tablet (20 mg total) by mouth daily. 03/26/22 04/25/22  Jorje Guild, NP  fluticasone (FLONASE) 50 MCG/ACT nasal spray Place 2 sprays into both nostrils daily. 01/02/22   Orma Render, NP  metoCLOPramide (REGLAN) 10 MG  tablet Take 1 tablet (10 mg total) by mouth every 8 (eight) hours as needed for nausea. 03/26/22   Jorje Guild, NP  ondansetron (ZOFRAN-ODT) 8 MG disintegrating tablet Take 1 tablet (8 mg total) by mouth every 8 (eight) hours as needed for nausea. 03/28/22   Orma Render, NP  promethazine (PHENERGAN) 12.5 MG tablet Take 1-2 tablets (12.5-25 mg total) by mouth every 8 (eight) hours as needed for nausea or vomiting (not controlled with ondansetron). 03/28/22   Orma Render, NP      Allergies    Patient has no known allergies.    Review of Systems   Review of Systems  Gastrointestinal:  Positive for nausea and vomiting.  Genitourinary:  Negative for pelvic pain, vaginal bleeding and vaginal discharge.    Physical Exam Updated Vital Signs BP 119/65   Pulse 83   Temp (!) 96.9 F (36.1 C) (Temporal)   Resp 15   Ht '5\' 2"'$  (1.575 m)   Wt 71.7 kg   LMP 02/08/2022   SpO2 100%   BMI 28.91 kg/m  Physical Exam Vitals and nursing note reviewed.  Constitutional:      General: She is not in acute distress.    Appearance: She is not ill-appearing.  HENT:     Head: Atraumatic.     Mouth/Throat:     Mouth: Mucous membranes are dry.  Eyes:     Conjunctiva/sclera: Conjunctivae normal.  Cardiovascular:  Rate and Rhythm: Normal rate and regular rhythm.     Pulses: Normal pulses.     Heart sounds: No murmur heard. Pulmonary:     Effort: Pulmonary effort is normal. No respiratory distress.     Breath sounds: Normal breath sounds.  Abdominal:     General: Abdomen is flat. There is no distension.     Palpations: Abdomen is soft.     Tenderness: There is no abdominal tenderness.  Musculoskeletal:        General: Normal range of motion.     Cervical back: Normal range of motion.  Skin:    General: Skin is warm and dry.     Capillary Refill: Capillary refill takes less than 2 seconds.  Neurological:     General: No focal deficit present.     Mental Status: She is alert.  Psychiatric:         Mood and Affect: Mood normal.     ED Results / Procedures / Treatments   Labs (all labs ordered are listed, but only abnormal results are displayed) Labs Reviewed  COMPREHENSIVE METABOLIC PANEL - Abnormal; Notable for the following components:      Result Value   Potassium 3.3 (*)    CO2 18 (*)    Calcium 10.4 (*)    Anion gap 16 (*)    All other components within normal limits  CBC - Abnormal; Notable for the following components:   MCHC 36.4 (*)    All other components within normal limits  URINALYSIS, ROUTINE W REFLEX MICROSCOPIC - Abnormal; Notable for the following components:   Ketones, ur 40 (*)    All other components within normal limits  PREGNANCY, URINE - Abnormal; Notable for the following components:   Preg Test, Ur POSITIVE (*)    All other components within normal limits  HCG, QUANTITATIVE, PREGNANCY - Abnormal; Notable for the following components:   hCG, Beta Chain, Quant, S [id number] (*)    All other components within normal limits  LIPASE, BLOOD    EKG None  Radiology No results found.  Procedures Procedures    Medications Ordered in ED Medications  dextrose 5 %-0.45 % sodium chloride infusion (0 mLs Intravenous Stopped 03/28/22 1701)  ondansetron (ZOFRAN) injection 4 mg (4 mg Intravenous Given 03/28/22 1559)  metoCLOPramide (REGLAN) injection 10 mg (10 mg Intravenous Given 03/28/22 1723)    ED Course/ Medical Decision Making/ A&P                           Medical Decision Making Amount and/or Complexity of Data Reviewed Labs: ordered.  Risk Prescription drug management.   Social determinants of health:  Social History   Socioeconomic History   Marital status: Married    Spouse name: Not on file   Number of children: Not on file   Years of education: Not on file   Highest education level: Not on file  Occupational History   Not on file  Tobacco Use   Smoking status: Never   Smokeless tobacco: Never  Vaping Use   Vaping Use:  Never used  Substance and Sexual Activity   Alcohol use: Not Currently   Drug use: Never   Sexual activity: Yes  Other Topics Concern   Not on file  Social History Narrative   Not on file   Social Determinants of Health   Financial Resource Strain: Not on file  Food Insecurity: Not on file  Transportation Needs:  Not on file  Physical Activity: Not on file  Stress: Not on file  Social Connections: Not on file  Intimate Partner Violence: Not on file     Initial impression:  This patient presents to the ED for concern of nausea and vomiting in first trimester, this involves an extensive number of treatment options, and is a complaint that carries with it a high risk of complications and morbidity.   Differentials include nausea and vomiting in pregnancy, hyperemesis gravidarum, viral infection.   Comorbidities affecting care:  None  Additional history obtained: Labs and imaging done 3 days ago  Lab Tests  I Ordered, reviewed, and interpreted labs and EKG.  The pertinent results include:  Ketones noted within urine No leukocytosis, CMP overall unremarkable negative lipase  EKG: NSR  Cardiac Monitoring:  The patient was maintained on a cardiac monitor.  I personally viewed and interpreted the cardiac monitored which showed an underlying rhythm of: NSR   Medicines ordered and prescription drug management:  I ordered medication including: D5 half-normal saline infusion along with Zofran 4 mg Reglan 10 mg Reevaluation of the patient after these medicines showed that the patient resolved  I have reviewed the patients home medicines and have made adjustments as needed    ED Course/Re-evaluation: Presents the ED for worsening nausea and vomiting in first term pregnancy.  I do have concern for hyperemesis gravidarum given that this is her third ED visit in 1 week.  Labs were very reassuring without any acute findings although she does have some ketonuria concerning for  dehydration.  Since she has not responded to her Unisom or to Reglan, she was given D5 half-normal saline fluids along with Zofran IV.  She states that she had some improvement was still feeling very nauseous and she states that she has not eaten anything since early this morning.  I gave her some crackers to eat and she still felt fairly nauseous.  I gave her within 10 mg Reglan IV and patient states that her nausea was now resolved and she was feeling better.  Will discharge home with Diclegis and advised to continue using her Reglan as prescribed.  She has appoint with her OB/GYN in 1 week.  Return precautions discussed.  Patient expresses understanding amenable to plan.  Disposition:  After consideration of the diagnostic results, physical exam, history and the patients response to treatment feel that the patent would benefit from discharge.   Vomiting or nausea pregnancy: Plan and management as described above. Discharged home in good condition. Final Clinical Impression(s) / ED Diagnoses Final diagnoses:  Vomiting or nausea of pregnancy    Rx / DC Orders ED Discharge Orders          Ordered    Doxylamine-Pyridoxine 10-10 MG TBEC        03/28/22 1805              Tonye Pearson, [place] 03/28/22 2253    Varney Biles, MD 04/03/22 1652

## 2022-03-28 NOTE — ED Triage Notes (Signed)
Patient here POV from Home.  Endorses N/V that began approximately 8 Days PTA. Was seen in ED a few days PTA and was treated and Discharged. States she is 7 Days Pregnant.   Pepcid and Reglan Prescriptions have been mildly effective and states N/V occurs every 3-4 Hours.   No Diarrhea. No Urinary Symptoms. No Bleeding.   NAD Noted during Triage. A&Ox4. GCS 15. Ambulatory.

## 2022-03-28 NOTE — Discharge Instructions (Addendum)
I have sent you in a medication called Diclegis, this is an antinausea medication used during early pregnancy.  Continue taking your prescribed Reglan and other medications and try to eating small frequent meals throughout the day.  If your nausea returns or significantly worsens, feel free to come back to the emergency department for treatment

## 2022-04-01 ENCOUNTER — Encounter (HOSPITAL_COMMUNITY): Payer: Self-pay | Admitting: Obstetrics & Gynecology

## 2022-04-01 ENCOUNTER — Inpatient Hospital Stay (HOSPITAL_COMMUNITY)
Admission: AD | Admit: 2022-04-01 | Discharge: 2022-04-01 | Disposition: A | Discharge status: Home / Self Care | Payer: BC Managed Care – PPO | Attending: Obstetrics and Gynecology | Admitting: Obstetrics and Gynecology

## 2022-04-01 DIAGNOSIS — R109 Unspecified abdominal pain: Secondary | ICD-10-CM | POA: Insufficient documentation

## 2022-04-01 DIAGNOSIS — Z3A01 Less than 8 weeks gestation of pregnancy: Secondary | ICD-10-CM | POA: Insufficient documentation

## 2022-04-01 DIAGNOSIS — O26891 Other specified pregnancy related conditions, first trimester: Secondary | ICD-10-CM | POA: Diagnosis not present

## 2022-04-01 DIAGNOSIS — O219 Vomiting of pregnancy, unspecified: Secondary | ICD-10-CM | POA: Diagnosis not present

## 2022-04-01 DIAGNOSIS — R143 Flatulence: Secondary | ICD-10-CM | POA: Insufficient documentation

## 2022-04-01 LAB — CBC WITH DIFFERENTIAL/PLATELET
Abs Immature Granulocytes: 0.02 10*3/uL (ref 0.00–0.07)
Basophils Absolute: 0 10*3/uL (ref 0.0–0.1)
Basophils Relative: 0 %
Eosinophils Absolute: 0.1 10*3/uL (ref 0.0–0.5)
Eosinophils Relative: 1 %
HCT: 38.4 % (ref 36.0–46.0)
Hemoglobin: 13.9 g/dL (ref 12.0–15.0)
Immature Granulocytes: 0 %
Lymphocytes Relative: 27 %
Lymphs Abs: 2.4 10*3/uL (ref 0.7–4.0)
MCH: 30.8 pg (ref 26.0–34.0)
MCHC: 36.2 g/dL — ABNORMAL HIGH (ref 30.0–36.0)
MCV: 85 fL (ref 80.0–100.0)
Monocytes Absolute: 0.6 10*3/uL (ref 0.1–1.0)
Monocytes Relative: 7 %
Neutro Abs: 5.7 10*3/uL (ref 1.7–7.7)
Neutrophils Relative %: 65 %
Platelets: 400 10*3/uL (ref 150–400)
RBC: 4.52 MIL/uL (ref 3.87–5.11)
RDW: 12 % (ref 11.5–15.5)
WBC: 8.9 10*3/uL (ref 4.0–10.5)
nRBC: 0 % (ref 0.0–0.2)

## 2022-04-01 LAB — COMPREHENSIVE METABOLIC PANEL
ALT: 30 U/L (ref 0–44)
AST: 17 U/L (ref 15–41)
Albumin: 4.2 g/dL (ref 3.5–5.0)
Alkaline Phosphatase: 60 U/L (ref 38–126)
Anion gap: 14 (ref 5–15)
BUN: 5 mg/dL — ABNORMAL LOW (ref 6–20)
CO2: 20 mmol/L — ABNORMAL LOW (ref 22–32)
Calcium: 9.6 mg/dL (ref 8.9–10.3)
Chloride: 103 mmol/L (ref 98–111)
Creatinine, Ser: 0.69 mg/dL (ref 0.44–1.00)
GFR, Estimated: >60 mL/min (ref >60)
Glucose, Bld: 97 mg/dL (ref 70–99)
Potassium: 3.3 mmol/L — ABNORMAL LOW (ref 3.5–5.1)
Sodium: 137 mmol/L (ref 135–145)
Total Bilirubin: 0.5 mg/dL (ref 0.3–1.2)
Total Protein: 7.6 g/dL (ref 6.5–8.1)

## 2022-04-01 LAB — URINALYSIS, ROUTINE W REFLEX MICROSCOPIC
Bacteria, UA: NONE SEEN
Bilirubin Urine: NEGATIVE
Glucose, UA: NEGATIVE mg/dL
Hgb urine dipstick: NEGATIVE
Ketones, ur: 80 mg/dL — AB
Leukocytes,Ua: NEGATIVE
Nitrite: NEGATIVE
Protein, ur: NEGATIVE mg/dL
Specific Gravity, Urine: 1.026 (ref 1.005–1.030)
pH: 5 (ref 5.0–8.0)

## 2022-04-01 MED ORDER — FAMOTIDINE IN NACL 20-0.9 MG/50ML-% IV SOLN
20.0000 mg | Freq: Once | INTRAVENOUS | Status: AC
  Administered 2022-04-01: 20 mg via INTRAVENOUS
  Filled 2022-04-01: qty 50

## 2022-04-01 MED ORDER — M.V.I. ADULT IV INJ
Freq: Once | INTRAVENOUS | Status: AC
  Filled 2022-04-01: qty 10

## 2022-04-01 MED ORDER — LACTATED RINGERS IV BOLUS
1000.0000 mL | Freq: Once | INTRAVENOUS | Status: AC
  Administered 2022-04-01: 1000 mL via INTRAVENOUS

## 2022-04-01 MED ORDER — SODIUM CHLORIDE 0.9 % IV SOLN
25.0000 mg | Freq: Once | INTRAVENOUS | Status: AC
Start: 2022-04-01 — End: 2022-04-01
  Administered 2022-04-01: 25 mg via INTRAVENOUS
  Filled 2022-04-01: qty 1

## 2022-04-01 NOTE — MAU Provider Note (Addendum)
History     CSN: [id number]  Arrival date and time: 04/01/22 0556   None     Chief Complaint  Patient presents with   Emesis   Nausea   Chest Pain   Abdominal Pain   Destiny Keith is a 36 y.o. [id number] at 34w3dwho receives care at [facility].  She presents today for Emesis, Nausea, and Abdominal Pain.  She states the nausea has been getting worse despite phenergan dosing.  She states that the phenergan makes her sleepy, but as soon as she wakes up she vomits.  She reports eating jello at midnight with vomiting <1 hour after.  She reports drinking water, but throws up immediately after.  She reports taking phenergan yesterday and is not taking any other medication. She states she last vomited one hour ago and it "was pure bile and sour." She reports some "burning" in her upper abdominal area and some cramping around her umbilicus. She states she has not had a bowel movement since last Wednesday. She is passing flatulence and states she is burping frequently. Patient denies vaginal bleeding and discharge.    Patient husband, TGershon Mussel reports patient is not keeping any food down. He states he has been ordering mobile transfusions as he is concerned with patient's inability to keep food down. He states next appt is scheduled for today at 1130am.     OB History     Gravida  4   Para  1   Term  1   Preterm      AB  2   Living  1      SAB  1   IAB  1   Ectopic      Multiple  0   Live Births  1           Past Medical History:  Diagnosis Date   Genital warts    Headache    Ovarian cyst     Past Surgical History:  Procedure Laterality Date   NO PAST SURGERIES      Family History  Problem Relation Age of Onset   Diabetes Maternal Grandmother    Hypertension Maternal Grandmother    Thyroid disease Maternal Grandmother     Social History   Tobacco Use   Smoking status: Never   Smokeless tobacco: Never  Vaping Use   Vaping Use: Never used   Substance Use Topics   Alcohol use: Not Currently   Drug use: Never    Allergies: No Known Allergies  Medications Prior to Admission  Medication Sig Dispense Refill Last Dose   famotidine (PEPCID) 20 MG tablet Take 1 tablet (20 mg total) by mouth daily. 30 tablet 0 03/31/2022   metoCLOPramide (REGLAN) 10 MG tablet Take 1 tablet (10 mg total) by mouth every 8 (eight) hours as needed for nausea. 30 tablet 0 03/29/2022   promethazine (PHENERGAN) 12.5 MG tablet Take 1-2 tablets (12.5-25 mg total) by mouth every 8 (eight) hours as needed for nausea or vomiting (not controlled with ondansetron). 40 tablet 3 03/31/2022   acetaminophen (TYLENOL) 325 MG tablet Take 650 mg by mouth every 6 (six) hours as needed for mild pain. (Patient not taking: Reported on 01/02/2022)      Doxylamine-Pyridoxine 10-10 MG TBEC Take 1 tablet on empty stomach with glass of water as needed for nausea and vomiting during pregnancy 60 tablet 0    fluticasone (FLONASE) 50 MCG/ACT nasal spray Place 2 sprays into both nostrils daily. 16 g 6  ondansetron (ZOFRAN-ODT) 8 MG disintegrating tablet Take 1 tablet (8 mg total) by mouth every 8 (eight) hours as needed for nausea. 40 tablet 3     Review of Systems  Eyes:  Negative for visual disturbance.  Gastrointestinal:  Positive for nausea and vomiting.  Genitourinary:  Negative for difficulty urinating, dysuria, vaginal bleeding and vaginal discharge.  Neurological:  Negative for dizziness, light-headedness and headaches.   Physical Exam   Blood pressure 120/61, pulse 71, temperature 98.4 F (36.9 C), temperature source Oral, resp. rate 19, height '5\' 2"'$  (1.575 m), weight 70.5 kg, last menstrual period 02/08/2022, SpO2 100 %, unknown if currently breastfeeding.  Physical Exam Vitals reviewed.  Constitutional:      Appearance: She is well-developed.  HENT:     Head: Normocephalic and atraumatic.  Eyes:     Conjunctiva/sclera: Conjunctivae normal.  Cardiovascular:      Rate and Rhythm: Normal rate.     Heart sounds: Normal heart sounds.  Pulmonary:     Effort: Pulmonary effort is normal. No respiratory distress.     Breath sounds: Normal breath sounds.  Abdominal:     General: Abdomen is flat. Bowel sounds are normal.     Palpations: Abdomen is soft.     Tenderness: There is no abdominal tenderness.  Musculoskeletal:        General: Normal range of motion.     Cervical back: Normal range of motion.  Skin:    General: Skin is warm and dry.  Neurological:     Mental Status: She is alert and oriented to person, place, and time.  Psychiatric:        Mood and Affect: Mood normal.        Behavior: Behavior normal.     MAU Course  Procedures Results for orders placed or performed during the hospital encounter of 04/01/22 (from the past 24 hour(s))  Urinalysis, Routine w reflex microscopic Urine, Clean Catch     Status: Abnormal   Collection Time: 04/01/22  6:24 AM  Result Value Ref Range   Color, Urine YELLOW YELLOW   APPearance CLEAR CLEAR   Specific Gravity, Urine 1.026 1.005 - 1.030   pH 5.0 5.0 - 8.0   Glucose, UA NEGATIVE NEGATIVE mg/dL   Hgb urine dipstick NEGATIVE NEGATIVE   Bilirubin Urine NEGATIVE NEGATIVE   Ketones, ur 80 (A) NEGATIVE mg/dL   Protein, ur NEGATIVE NEGATIVE mg/dL   Nitrite NEGATIVE NEGATIVE   Leukocytes,Ua NEGATIVE NEGATIVE   RBC / HPF 0-5 0 - 5 RBC/hpf   WBC, UA 0-5 0 - 5 WBC/hpf   Bacteria, UA NONE SEEN NONE SEEN   Squamous Epithelial / LPF 0-5 0 - 5   Mucus PRESENT   CBC with Differential/Platelet     Status: Abnormal   Collection Time: 04/01/22  6:42 AM  Result Value Ref Range   WBC 8.9 4.0 - 10.5 K/uL   RBC 4.52 3.87 - 5.11 MIL/uL   Hemoglobin 13.9 12.0 - 15.0 g/dL   HCT 38.4 36.0 - 46.0 %   MCV 85.0 80.0 - 100.0 fL   MCH 30.8 26.0 - 34.0 pg   MCHC 36.2 (H) 30.0 - 36.0 g/dL   RDW 12.0 11.5 - 15.5 %   Platelets 400 150 - 400 K/uL   nRBC 0.0 0.0 - 0.2 %   Neutrophils Relative % 65 %   Neutro Abs 5.7 1.7  - 7.7 K/uL   Lymphocytes Relative 27 %   Lymphs Abs 2.4 0.7 - 4.0  K/uL   Monocytes Relative 7 %   Monocytes Absolute 0.6 0.1 - 1.0 K/uL   Eosinophils Relative 1 %   Eosinophils Absolute 0.1 0.0 - 0.5 K/uL   Basophils Relative 0 %   Basophils Absolute 0.0 0.0 - 0.1 K/uL   Immature Granulocytes 0 %   Abs Immature Granulocytes 0.02 0.00 - 0.07 K/uL  Comprehensive metabolic panel     Status: Abnormal   Collection Time: 04/01/22  6:42 AM  Result Value Ref Range   Sodium 137 135 - 145 mmol/L   Potassium 3.3 (L) 3.5 - 5.1 mmol/L   Chloride 103 98 - 111 mmol/L   CO2 20 (L) 22 - 32 mmol/L   Glucose, Bld 97 70 - 99 mg/dL   BUN 5 (L) 6 - 20 mg/dL   Creatinine, Ser 0.69 0.44 - 1.00 mg/dL   Calcium 9.6 8.9 - 10.3 mg/dL   Total Protein 7.6 6.5 - 8.1 g/dL   Albumin 4.2 3.5 - 5.0 g/dL   AST 17 15 - 41 U/L   ALT 30 0 - 44 U/L   Alkaline Phosphatase 60 38 - 126 U/L   Total Bilirubin 0.5 0.3 - 1.2 mg/dL   GFR, Estimated >60 >60 mL/min   Anion gap 14 5 - 15    MDM Start IV LR Bolus Antiemetic PPI Assessment and Plan  36 year old, [id number]  SIUP at 7.3 weeks Nausea and Vomiting  -Reviewed POC with patient. -Plan to start IV and LR bolus. -Labs ordered. -Pepcid for heartburn. -Provider to delivery, but will return for exam.  -Patient agreeable and without questions.   Maryann Conners 04/01/2022, 6:45 AM   Reassessment (7:39 AM)  -Exam performed. -Discussed treatment plan and management at home. -Informed that Diclegis was sent by previous provider and encouraged to pick up.  -Patient reports nausea somewhat improved. -Discussed additional fluids in form of MVI and canceling mobile transfusion.  Husband agreeable with this. -Reassured that urine not reflective of severe dehydration.  -Plan to continue to monitor. -Report to be given to E. Purcell Nails, NP upon her arrival  Maryann Conners MSN, CNM Energy manager, [facility] [employer]     Patient given 3  liters of IV fluids, pepcid, & phenergan. Continues to have some nausea & no vomiting. Has multiple prescriptions at home & receiving in home IV infusions. Stable for discharge    1. Nausea and vomiting in pregnancy   2. [redacted] weeks gestation of pregnancy    Continue meds as previously prescribed  Jorje Guild, NP

## 2022-04-01 NOTE — MAU Note (Signed)
Destiny Keith is a 36 y.o. at 65w3dhere in MAU reporting: [place], but it has not helped. Has continued to have n/v since, with no relief, unable to keep any food or water down. Reports pressure in middle of chest and upper mid back since Thursday as well, "it comes and goes". Reports abdominal cramping "around my belly button" but "I haven't had a bowel movement for 7 days" states she has been taking stool softeners, but they have not worked. Also reports occ burning feeling in upper abdomen.  Denies VB, LOF, or abnormal discharge.   Onset of complaint: 7/13 Pain score: 8 - chest; 10 - abdomen Vitals:   04/01/22 0615  BP: 102/62  Pulse: 95  Resp: 19  Temp: 98.4 F (36.9 C)  SpO2: 100%    FHT:n/a  Lab orders placed from triage: u/a

## 2022-04-05 DIAGNOSIS — N911 Secondary amenorrhea: Secondary | ICD-10-CM | POA: Diagnosis not present

## 2022-04-06 ENCOUNTER — Inpatient Hospital Stay (HOSPITAL_COMMUNITY)
Admission: AD | Admit: 2022-04-06 | Discharge: 2022-04-07 | Disposition: A | Discharge status: Home / Self Care | Payer: BC Managed Care – PPO | Attending: Obstetrics & Gynecology | Admitting: Obstetrics & Gynecology

## 2022-04-06 DIAGNOSIS — O99611 Diseases of the digestive system complicating pregnancy, first trimester: Secondary | ICD-10-CM | POA: Insufficient documentation

## 2022-04-06 DIAGNOSIS — O219 Vomiting of pregnancy, unspecified: Secondary | ICD-10-CM

## 2022-04-06 DIAGNOSIS — O210 Mild hyperemesis gravidarum: Secondary | ICD-10-CM | POA: Diagnosis not present

## 2022-04-06 DIAGNOSIS — K5903 Drug induced constipation: Secondary | ICD-10-CM | POA: Diagnosis not present

## 2022-04-06 DIAGNOSIS — O26891 Other specified pregnancy related conditions, first trimester: Secondary | ICD-10-CM | POA: Insufficient documentation

## 2022-04-06 DIAGNOSIS — Z3A08 8 weeks gestation of pregnancy: Secondary | ICD-10-CM

## 2022-04-06 DIAGNOSIS — K5909 Other constipation: Secondary | ICD-10-CM | POA: Insufficient documentation

## 2022-04-06 LAB — URINALYSIS, ROUTINE W REFLEX MICROSCOPIC
Bilirubin Urine: NEGATIVE
Glucose, UA: NEGATIVE mg/dL
Hgb urine dipstick: NEGATIVE
Ketones, ur: 80 mg/dL — AB
Leukocytes,Ua: NEGATIVE
Nitrite: NEGATIVE
Protein, ur: 30 mg/dL — AB
Specific Gravity, Urine: 1.026 (ref 1.005–1.030)
pH: 7 (ref 5.0–8.0)

## 2022-04-06 MED ORDER — SORBITOL 70 % SOLN
960.0000 mL | TOPICAL_OIL | Freq: Once | ORAL | Status: AC
  Administered 2022-04-06: 960 mL via RECTAL
  Filled 2022-04-06: qty 473

## 2022-04-06 MED ORDER — GLYCOPYRROLATE 0.2 MG/ML IJ SOLN
0.2000 mg | Freq: Once | INTRAMUSCULAR | Status: AC
  Administered 2022-04-06: 0.2 mg via INTRAVENOUS
  Filled 2022-04-06: qty 1

## 2022-04-06 MED ORDER — SCOPOLAMINE 1 MG/3DAYS TD PT72
1.0000 | MEDICATED_PATCH | Freq: Once | TRANSDERMAL | Status: DC
  Administered 2022-04-06: 1.5 mg via TRANSDERMAL
  Filled 2022-04-06: qty 1

## 2022-04-06 MED ORDER — LACTATED RINGERS IV BOLUS
1000.0000 mL | Freq: Once | INTRAVENOUS | Status: AC
  Administered 2022-04-06: 1000 mL via INTRAVENOUS

## 2022-04-06 MED ORDER — SCOPOLAMINE 1 MG/3DAYS TD PT72: 1.0000 | MEDICATED_PATCH | TRANSDERMAL | 0 refills | Status: DC

## 2022-04-06 MED ORDER — PROMETHAZINE HCL 25 MG PO TABS: 25.0000 mg | ORAL_TABLET | Freq: Three times a day (TID) | ORAL | 0 refills | Status: DC | PRN

## 2022-04-06 MED ORDER — FAMOTIDINE IN NACL 20-0.9 MG/50ML-% IV SOLN
20.0000 mg | Freq: Once | INTRAVENOUS | Status: AC
  Administered 2022-04-06: 20 mg via INTRAVENOUS
  Filled 2022-04-06: qty 50

## 2022-04-06 NOTE — Discharge Instructions (Signed)
You have constipation which is hard stools that are difficult to pass. It is important to have regular bowel movements every 1-3 days that are soft and easy to pass. Hard stools increase your risk of hemorrhoids and are very uncomfortable.   To prevent constipation you can increase the amount of fiber in your diet. Examples of foods with fiber are leafy greens, whole grain breads, oatmeal and other grains.  It is also important to drink at least 64 ounces of water everyday.   If you have not had a bowel movement in 4-5 days, you made need to clean out your bowel.  This will help you establish normal movements through your bowel.    Miralax Clean out Take 8 capfuls of miralax in 64 oz of gatorade. You can use any fluid that appeals to you (gatorade, water, juice) Continue to drink at least 64 ounces of water throughout the day You can repeat with another 8 capfuls of miralax in 64 oz of gatorade if you are not having a large amount of stools You will need to be at home and close to a bathroom for about 8 hours when you do the above as you may need to go to the bathroom frequently.   After you are cleaned out: - Start Colace100mg twice daily - Start Miralax once daily - Start a daily fiber supplement like metamucil or citrucel - You can safely use enemas in pregnancy  - if you are having diarrhea you can reduce to Colace once a day or miralax every other day or a 1/2 capful daily.   

## 2022-04-06 NOTE — MAU Note (Signed)
Pt says has been vomiting x3 weeks  Has been here - got IV fluids Med today- took Zofran- last at [place] and Reglan last time was Wed-    went to Dr Gaetano Net yesterday - he told her to stop other meds and only Zofran .Has vomited today 10 today Ate today - chicken noodle soup

## 2022-04-06 NOTE — MAU Note (Signed)
Says Has not had BM in 10 days- takes Ducolax qother day

## 2022-04-06 NOTE — MAU Note (Signed)
[facility]- IV fluids- husband called them on Wed - 1 Bag  - and Pepcid and Vit B6 .

## 2022-04-06 NOTE — MAU Provider Note (Cosign Needed Addendum)
History     CSN: [id number]  Arrival date and time: 04/06/22 1807   Event Date/Time   First Provider Initiated Contact with Patient 04/06/22 2028      Chief Complaint  Patient presents with   Emesis   Destiny Keith, a  36 y.o. [phone] at 81w1dpresents to MAU with complaints of vomiting and abdominal discomfort. Patient states the emesis is on-going for the last 3 weeks. Today she is having new onset of brown/red vomit x2. She also endorses burning in her throat after vomiting. Last BM 10 days ago despite daily Stool softeners. She also denies flatus. "Feels bloated and uncomfortable." She denies abdominal pain, vaginal bleeding and abnormal discharge.   Emesis  There has been no fever. Pertinent negatives include no abdominal pain, chills, diarrhea, dizziness or headaches.    OB History     Gravida  4   Para  1   Term  1   Preterm      AB  2   Living  1      SAB  1   IAB  1   Ectopic      Multiple  0   Live Births  1           Past Medical History:  Diagnosis Date   Genital warts    Headache    Ovarian cyst     Past Surgical History:  Procedure Laterality Date   NO PAST SURGERIES      Family History  Problem Relation Age of Onset   Diabetes Maternal Grandmother    Hypertension Maternal Grandmother    Thyroid disease Maternal Grandmother     Social History   Tobacco Use   Smoking status: Never   Smokeless tobacco: Never  Vaping Use   Vaping Use: Never used  Substance Use Topics   Alcohol use: Not Currently   Drug use: Never    Allergies: No Known Allergies  Medications Prior to Admission  Medication Sig Dispense Refill Last Dose   ondansetron (ZOFRAN-ODT) 8 MG disintegrating tablet Take 1 tablet (8 mg total) by mouth every 8 (eight) hours as needed for nausea. 40 tablet 3 04/06/2022   acetaminophen (TYLENOL) 325 MG tablet Take 650 mg by mouth every 6 (six) hours as needed for mild pain. (Patient not taking: Reported on  01/02/2022)   More than a month   Doxylamine-Pyridoxine 10-10 MG TBEC Take 1 tablet on empty stomach with glass of water as needed for nausea and vomiting during pregnancy 60 tablet 0 none   famotidine (PEPCID) 20 MG tablet Take 1 tablet (20 mg total) by mouth daily. 30 tablet 0 04/04/2022   fluticasone (FLONASE) 50 MCG/ACT nasal spray Place 2 sprays into both nostrils daily. 16 g 6 More than a month   metoCLOPramide (REGLAN) 10 MG tablet Take 1 tablet (10 mg total) by mouth every 8 (eight) hours as needed for nausea. 30 tablet 0 04/04/2022   [DISCONTINUED] promethazine (PHENERGAN) 12.5 MG tablet Take 1-2 tablets (12.5-25 mg total) by mouth every 8 (eight) hours as needed for nausea or vomiting (not controlled with ondansetron). 40 tablet 3 04/04/2022    Review of Systems  Constitutional:  Negative for chills.  Gastrointestinal:  Positive for abdominal distention, constipation, nausea and vomiting. Negative for abdominal pain and diarrhea.  Genitourinary:  Negative for flank pain, vaginal bleeding, vaginal discharge and vaginal pain.  Neurological:  Negative for dizziness, light-headedness and headaches.   Physical Exam   Blood pressure 116/72,  pulse 83, temperature 98.9 F (37.2 C), temperature source Oral, resp. rate 20, height '5\' 2"'$  (1.575 m), weight 68.4 kg, last menstrual period 02/08/2022, unknown if currently breastfeeding.  Physical Exam Vitals and nursing note reviewed.  Constitutional:      General: She is not in acute distress. HENT:     Head: Normocephalic.  Cardiovascular:     Rate and Rhythm: Normal rate.  Pulmonary:     Effort: Pulmonary effort is normal.  Abdominal:     General: There is distension.     Palpations: There is no mass.     Tenderness: There is abdominal tenderness. There is no guarding.  Musculoskeletal:     Cervical back: Normal range of motion.  Skin:    General: Skin is warm and dry.     Capillary Refill: Capillary refill takes 2 to 3 seconds.      Coloration: Skin is pale.  Neurological:     Mental Status: She is alert and oriented to person, place, and time.  Psychiatric:        Mood and Affect: Mood normal.     MAU Course  Procedures Lab Orders         Urinalysis, Routine w reflex microscopic Urine, Clean Catch    Orders Placed This Encounter  Procedures   Urinalysis, Routine w reflex microscopic Urine, Clean Catch   Insert peripheral IV   Discharge patient   Meds ordered this encounter  Medications   sorbitol, milk of mag, mineral oil, glycerin (SMOG) enema   lactated ringers bolus 1,000 mL   famotidine (PEPCID) IVPB 20 mg premix   scopolamine (TRANSDERM-SCOP) 1 MG/3DAYS 1.5 mg   glycopyrrolate (ROBINUL) injection 0.2 mg   promethazine (PHENERGAN) 25 MG tablet    Sig: Take 1 tablet (25 mg total) by mouth every 8 (eight) hours as needed for refractory nausea / vomiting.    Dispense:  30 tablet    Refill:  0    Order Specific Question:   Supervising Provider    Answer:   Donnamae Jude [[id number]]   scopolamine (TRANSDERM-SCOP) 1 MG/3DAYS    Sig: Place 1 patch (1.5 mg total) onto the skin every 3 (three) days.    Dispense:  10 patch    Refill:  0    Order Specific Question:   Supervising Provider    Answer:   Donnamae Jude [[id number]]    CBC/ CMP/ UA completed 5 days ago- Normal.  Results for orders placed or performed during the hospital encounter of 04/06/22 (from the past 24 hour(s))  Urinalysis, Routine w reflex microscopic Urine, Clean Catch     Status: Abnormal   Collection Time: 04/06/22  7:23 PM  Result Value Ref Range   Color, Urine AMBER (A) YELLOW   APPearance CLOUDY (A) CLEAR   Specific Gravity, Urine 1.026 1.005 - 1.030   pH 7.0 5.0 - 8.0   Glucose, UA NEGATIVE NEGATIVE mg/dL   Hgb urine dipstick NEGATIVE NEGATIVE   Bilirubin Urine NEGATIVE NEGATIVE   Ketones, ur 80 (A) NEGATIVE mg/dL   Protein, ur 30 (A) NEGATIVE mg/dL   Nitrite NEGATIVE NEGATIVE   Leukocytes,Ua NEGATIVE NEGATIVE   RBC / HPF 0-5 0 -  5 RBC/hpf   WBC, UA 0-5 0 - 5 WBC/hpf   Bacteria, UA MANY (A) NONE SEEN   Squamous Epithelial / LPF 6-10 0 - 5   Mucus PRESENT    Amorphous Crystal PRESENT    Transfer of Care to R. Renato Battles,  CNM @ 2100.    [name], MSN, [place] [facility]  04/06/22 9:14 PM   Report given to and care assumed by Robyne Askew, FNP @ [address], [place]  04/06/2022  9:51 PM   MDM  Patient given IV fluids & meds - reports improvement in symptoms & no further vomiting in MAU SMOG enema for constipation with some results Assessment and Plan   1. Hyperemesis complicating pregnancy, antepartum  -continue meds previously prescribed. Discussed taking meds on schedule. Rx scopolamine patch  2. Drug-induced constipation  -D/c zofran -Discussed management of constipation at home  3. [redacted] weeks gestation of pregnancy  -follow up with OB as scheduled    Jorje Guild, NP

## 2022-04-23 ENCOUNTER — Encounter (HOSPITAL_COMMUNITY): Payer: Self-pay | Admitting: Obstetrics and Gynecology

## 2022-04-23 ENCOUNTER — Inpatient Hospital Stay (HOSPITAL_COMMUNITY)
Admission: AD | Admit: 2022-04-23 | Discharge: 2022-04-23 | Disposition: A | Discharge status: Home / Self Care | Payer: BC Managed Care – PPO | Attending: Obstetrics and Gynecology | Admitting: Obstetrics and Gynecology

## 2022-04-23 ENCOUNTER — Other Ambulatory Visit: Payer: Self-pay

## 2022-04-23 DIAGNOSIS — O99891 Other specified diseases and conditions complicating pregnancy: Secondary | ICD-10-CM | POA: Insufficient documentation

## 2022-04-23 DIAGNOSIS — O2341 Unspecified infection of urinary tract in pregnancy, first trimester: Secondary | ICD-10-CM | POA: Diagnosis not present

## 2022-04-23 DIAGNOSIS — Z3A10 10 weeks gestation of pregnancy: Secondary | ICD-10-CM | POA: Diagnosis not present

## 2022-04-23 DIAGNOSIS — N390 Urinary tract infection, site not specified: Secondary | ICD-10-CM | POA: Insufficient documentation

## 2022-04-23 DIAGNOSIS — Z8669 Personal history of other diseases of the nervous system and sense organs: Secondary | ICD-10-CM | POA: Insufficient documentation

## 2022-04-23 DIAGNOSIS — B9689 Other specified bacterial agents as the cause of diseases classified elsewhere: Secondary | ICD-10-CM | POA: Insufficient documentation

## 2022-04-23 DIAGNOSIS — O210 Mild hyperemesis gravidarum: Secondary | ICD-10-CM | POA: Diagnosis not present

## 2022-04-23 LAB — URINALYSIS, ROUTINE W REFLEX MICROSCOPIC
Bilirubin Urine: NEGATIVE
Glucose, UA: NEGATIVE mg/dL
Ketones, ur: 20 mg/dL — AB
Nitrite: POSITIVE — AB
Protein, ur: 100 mg/dL — AB
Specific Gravity, Urine: 1.024 (ref 1.005–1.030)
WBC, UA: >50 WBC/hpf — ABNORMAL HIGH (ref 0–5)
pH: 6 (ref 5.0–8.0)

## 2022-04-23 LAB — CBC
HCT: 38.2 % (ref 36.0–46.0)
Hemoglobin: 13.8 g/dL (ref 12.0–15.0)
MCH: 31.1 pg (ref 26.0–34.0)
MCHC: 36.1 g/dL — ABNORMAL HIGH (ref 30.0–36.0)
MCV: 86 fL (ref 80.0–100.0)
Platelets: 308 10*3/uL (ref 150–400)
RBC: 4.44 MIL/uL (ref 3.87–5.11)
RDW: 12.4 % (ref 11.5–15.5)
WBC: 10.6 10*3/uL — ABNORMAL HIGH (ref 4.0–10.5)
nRBC: 0 % (ref 0.0–0.2)

## 2022-04-23 LAB — WET PREP, GENITAL
Clue Cells Wet Prep HPF POC: NONE SEEN
Sperm: NONE SEEN
Trich, Wet Prep: NONE SEEN
WBC, Wet Prep HPF POC: <10 (ref <10)
Yeast Wet Prep HPF POC: NONE SEEN

## 2022-04-23 MED ORDER — SODIUM CHLORIDE 0.9 % IV SOLN
2.0000 g | INTRAVENOUS | Status: DC
  Administered 2022-04-23: 2 g via INTRAVENOUS
  Filled 2022-04-23: qty 20

## 2022-04-23 MED ORDER — CEFADROXIL 500 MG PO CAPS: 500.0000 mg | ORAL_CAPSULE | Freq: Two times a day (BID) | ORAL | 0 refills | Status: DC

## 2022-04-23 MED ORDER — SODIUM CHLORIDE 0.9 % IV SOLN: INTRAVENOUS | Status: DC

## 2022-04-23 MED ORDER — METOCLOPRAMIDE HCL 5 MG/ML IJ SOLN
10.0000 mg | Freq: Once | INTRAMUSCULAR | Status: AC
  Administered 2022-04-23: 10 mg via INTRAVENOUS
  Filled 2022-04-23: qty 2

## 2022-04-23 MED ORDER — PHENAZOPYRIDINE HCL 100 MG PO TABS
200.0000 mg | ORAL_TABLET | Freq: Once | ORAL | Status: AC
  Administered 2022-04-23: 200 mg via ORAL
  Filled 2022-04-23: qty 2

## 2022-04-23 NOTE — MAU Note (Signed)
.  Destiny Keith is a 36 y.o. at 65w4dhere in MAU reporting: since 2300 last night dysuria and burning while voiding, as well as increased frequency with oliguria. Pt states she has been going every hour. Pt reports she can not barely sit down due to the pain. Pt reports ongoing N/V and dehydration, unable to eat or drink. Pt also reports a long yellow stingy discharge came out at 0300 this am. Pt denies VB, LOF. Last intercourse 2 days ago.   Onset of complaint: 2300 Pain score: 10/10 Vitals:   04/23/22 0555  BP: 115/85  Pulse: 93  Resp: 20  Temp: 98 F (36.7 C)  SpO2: 99%     FHT:160 Lab orders placed from triage:  UA

## 2022-04-23 NOTE — MAU Provider Note (Signed)
History     [id number]  Arrival date and time: 04/23/22 0546    Chief Complaint  Patient presents with   Dysuria     HPI Destiny Keith is a 36 y.o. at 62w4dwho presents for dysuria.  Symptoms started last night.  Reports burning with urination and increased urinary frequency.  Pain worse with sitting down.  Has had ongoing issue with hyperemesis during this pregnancy and unable to take oral medications for her symptoms.  Denies fever, hematuria, or vaginal bleeding.  Denies flank pain.  Reports yellow mucoid discharge.  OB History     Gravida  4   Para  1   Term  1   Preterm      AB  2   Living  1      SAB  1   IAB  1   Ectopic      Multiple  0   Live Births  1           Past Medical History:  Diagnosis Date   Genital warts    Headache    Ovarian cyst     Past Surgical History:  Procedure Laterality Date   NO PAST SURGERIES      Family History  Problem Relation Age of Onset   Diabetes Maternal Grandmother    Hypertension Maternal Grandmother    Thyroid disease Maternal Grandmother     No Known Allergies  No current facility-administered medications on file prior to encounter.   Current Outpatient Medications on File Prior to Encounter  Medication Sig Dispense Refill   famotidine (PEPCID) 20 MG tablet Take 1 tablet (20 mg total) by mouth daily. 30 tablet 0   acetaminophen (TYLENOL) 325 MG tablet Take 650 mg by mouth every 6 (six) hours as needed for mild pain. (Patient not taking: Reported on 01/02/2022)     fluticasone (FLONASE) 50 MCG/ACT nasal spray Place 2 sprays into both nostrils daily. 16 g 6   metoCLOPramide (REGLAN) 10 MG tablet Take 1 tablet (10 mg total) by mouth every 8 (eight) hours as needed for nausea. 30 tablet 0   predniSONE (DELTASONE) 10 MG tablet Take by mouth.     promethazine (PHENERGAN) 25 MG tablet Take 1 tablet (25 mg total) by mouth every 8 (eight) hours as needed for refractory nausea / vomiting. 30 tablet 0    scopolamine (TRANSDERM-SCOP) 1 MG/3DAYS Place 1 patch (1.5 mg total) onto the skin every 3 (three) days. 10 patch 0     ROS Pertinent positives and negative per HPI, all others reviewed and negative  Physical Exam   BP (!) 102/53 (BP Location: Left Arm)   Pulse 77   Temp 98.2 F (36.8 C) (Oral)   Resp 19   Ht '5\' 2"'$  (1.575 m)   Wt 65.3 kg   LMP 02/08/2022   SpO2 98%   BMI 26.34 kg/m   Patient Vitals for the past 24 hrs:  BP Temp Temp src Pulse Resp SpO2 Height Weight  04/23/22 0730 (!) 102/53 98.2 F (36.8 C) Oral 77 19 98 % -- --  04/23/22 0555 115/85 98 F (36.7 C) Oral 93 20 99 % '5\' 2"'$  (1.575 m) 65.3 kg    Physical Exam Vitals and nursing note reviewed.  Constitutional:      General: She is in acute distress (rocking in the bed).     Appearance: Normal appearance. She is not ill-appearing or toxic-appearing.  HENT:     Head: Normocephalic and atraumatic.  Eyes:     Conjunctiva/sclera: Conjunctivae normal.     Pupils: Pupils are equal, round, and reactive to light.  Pulmonary:     Effort: Pulmonary effort is normal. No respiratory distress.  Abdominal:     Palpations: Abdomen is soft.     Tenderness: There is no abdominal tenderness. There is no right CVA tenderness, left CVA tenderness, guarding or rebound.  Skin:    General: Skin is warm and dry.  Neurological:     Mental Status: She is alert.     Labs Results for orders placed or performed during the hospital encounter of 04/23/22 (from the past 24 hour(s))  Urinalysis, Routine w reflex microscopic     Status: Abnormal   Collection Time: 04/23/22  6:04 AM  Result Value Ref Range   Color, Urine AMBER (A) YELLOW   APPearance CLOUDY (A) CLEAR   Specific Gravity, Urine 1.024 1.005 - 1.030   pH 6.0 5.0 - 8.0   Glucose, UA NEGATIVE NEGATIVE mg/dL   Hgb urine dipstick SMALL (A) NEGATIVE   Bilirubin Urine NEGATIVE NEGATIVE   Ketones, ur 20 (A) NEGATIVE mg/dL   Protein, ur 100 (A) NEGATIVE mg/dL   Nitrite  POSITIVE (A) NEGATIVE   Leukocytes,Ua LARGE (A) NEGATIVE   RBC / HPF 21-50 0 - 5 RBC/hpf   WBC, UA >50 (H) 0 - 5 WBC/hpf   Bacteria, UA MANY (A) NONE SEEN   Squamous Epithelial / LPF 11-20 0 - 5   Mucus PRESENT   Wet prep, genital     Status: None   Collection Time: 04/23/22  6:33 AM  Result Value Ref Range   Yeast Wet Prep HPF POC NONE SEEN NONE SEEN   Trich, Wet Prep NONE SEEN NONE SEEN   Clue Cells Wet Prep HPF POC NONE SEEN NONE SEEN   WBC, Wet Prep HPF POC <10 <10   Sperm NONE SEEN   CBC     Status: Abnormal   Collection Time: 04/23/22  6:46 AM  Result Value Ref Range   WBC 10.6 (H) 4.0 - 10.5 K/uL   RBC 4.44 3.87 - 5.11 MIL/uL   Hemoglobin 13.8 12.0 - 15.0 g/dL   HCT 38.2 36.0 - 46.0 %   MCV 86.0 80.0 - 100.0 fL   MCH 31.1 26.0 - 34.0 pg   MCHC 36.1 (H) 30.0 - 36.0 g/dL   RDW 12.4 11.5 - 15.5 %   Platelets 308 150 - 400 K/uL   nRBC 0.0 0.0 - 0.2 %    Imaging No results found.  MAU Course  Procedures Lab Orders         Wet prep, genital         Culture, OB Urine         Urinalysis, Routine w reflex microscopic         CBC     Meds ordered this encounter  Medications   0.9 %  sodium chloride infusion   cefTRIAXone (ROCEPHIN) 2 g in sodium chloride 0.9 % 100 mL IVPB    Order Specific Question:   Antibiotic Indication:    Answer:   UTI   metoCLOPramide (REGLAN) injection 10 mg   phenazopyridine (PYRIDIUM) tablet 200 mg   cefadroxil (DURICEF) 500 MG capsule    Sig: Take 1 capsule (500 mg total) by mouth 2 (two) times daily for 7 days.    Dispense:  14 capsule    Refill:  0    Order Specific Question:   Supervising  Provider    Answer:   Verita Schneiders A [[id number]]   Imaging Orders  No imaging studies ordered today    MDM Reviewed prenatal records with [facility] in [place] ordered, results reviewed Given IV fluids, rocephin, reglan, & pyridium in MAU. Will rx duricef Assessment and Plan   1. Urinary tract infection in mother during  first trimester of pregnancy   2. [redacted] weeks gestation of pregnancy    -U/a positive for nitrites. Urine culture sent. Patient given IV rocephin 2 gm in MAU due to HEG. Will rx duricef - discussed premedicating with antiemetic.  -Reviewed s/s of pyelo & reasons to return to MAU -Symptoms improved after pyridium -Keep f/u with [facility] on Thursday  Jorje Guild, NP 04/23/22 7:59 AM

## 2022-04-24 LAB — GC/CHLAMYDIA PROBE AMP (~~LOC~~) NOT AT ARMC
Chlamydia: NEGATIVE
Comment: NEGATIVE
Comment: NORMAL
Neisseria Gonorrhea: NEGATIVE

## 2022-04-25 DIAGNOSIS — Z3481 Encounter for supervision of other normal pregnancy, first trimester: Secondary | ICD-10-CM | POA: Diagnosis not present

## 2022-04-25 DIAGNOSIS — Z3685 Encounter for antenatal screening for Streptococcus B: Secondary | ICD-10-CM | POA: Diagnosis not present

## 2022-04-25 LAB — CULTURE, OB URINE
Culture: >=100000 — AB
Special Requests: NORMAL

## 2022-04-26 ENCOUNTER — Other Ambulatory Visit: Payer: Self-pay

## 2022-04-26 ENCOUNTER — Encounter (HOSPITAL_COMMUNITY): Payer: Self-pay | Admitting: Obstetrics and Gynecology

## 2022-04-26 ENCOUNTER — Inpatient Hospital Stay (HOSPITAL_COMMUNITY)
Admission: AD | Admit: 2022-04-26 | Discharge: 2022-04-30 | DRG: 832 | Disposition: A | Discharge status: Home / Self Care | Payer: BC Managed Care – PPO | Attending: Obstetrics and Gynecology | Admitting: Obstetrics and Gynecology

## 2022-04-26 DIAGNOSIS — O211 Hyperemesis gravidarum with metabolic disturbance: Principal | ICD-10-CM | POA: Diagnosis present

## 2022-04-26 DIAGNOSIS — Z113 Encounter for screening for infections with a predominantly sexual mode of transmission: Secondary | ICD-10-CM | POA: Diagnosis not present

## 2022-04-26 DIAGNOSIS — O99351 Diseases of the nervous system complicating pregnancy, first trimester: Secondary | ICD-10-CM | POA: Diagnosis not present

## 2022-04-26 DIAGNOSIS — O210 Mild hyperemesis gravidarum: Principal | ICD-10-CM | POA: Diagnosis present

## 2022-04-26 DIAGNOSIS — Z3A11 11 weeks gestation of pregnancy: Secondary | ICD-10-CM

## 2022-04-26 DIAGNOSIS — O2341 Unspecified infection of urinary tract in pregnancy, first trimester: Secondary | ICD-10-CM | POA: Diagnosis not present

## 2022-04-26 DIAGNOSIS — G43909 Migraine, unspecified, not intractable, without status migrainosus: Secondary | ICD-10-CM | POA: Diagnosis present

## 2022-04-26 DIAGNOSIS — N390 Urinary tract infection, site not specified: Secondary | ICD-10-CM | POA: Diagnosis present

## 2022-04-26 DIAGNOSIS — Z3481 Encounter for supervision of other normal pregnancy, first trimester: Secondary | ICD-10-CM | POA: Diagnosis not present

## 2022-04-26 LAB — COMPREHENSIVE METABOLIC PANEL
ALT: 25 U/L (ref 0–44)
AST: 19 U/L (ref 15–41)
Albumin: 3.3 g/dL — ABNORMAL LOW (ref 3.5–5.0)
Alkaline Phosphatase: 48 U/L (ref 38–126)
Anion gap: 5 (ref 5–15)
BUN: 7 mg/dL (ref 6–20)
CO2: 26 mmol/L (ref 22–32)
Calcium: 9 mg/dL (ref 8.9–10.3)
Chloride: 108 mmol/L (ref 98–111)
Creatinine, Ser: 0.65 mg/dL (ref 0.44–1.00)
GFR, Estimated: >60 mL/min (ref >60)
Glucose, Bld: 100 mg/dL — ABNORMAL HIGH (ref 70–99)
Potassium: 3.9 mmol/L (ref 3.5–5.1)
Sodium: 139 mmol/L (ref 135–145)
Total Bilirubin: 0.2 mg/dL — ABNORMAL LOW (ref 0.3–1.2)
Total Protein: 5.9 g/dL — ABNORMAL LOW (ref 6.5–8.1)

## 2022-04-26 LAB — MAGNESIUM: Magnesium: 1.8 mg/dL (ref 1.7–2.4)

## 2022-04-26 LAB — CBC
HCT: 35.2 % — ABNORMAL LOW (ref 36.0–46.0)
Hemoglobin: 12.3 g/dL (ref 12.0–15.0)
MCH: 30.9 pg (ref 26.0–34.0)
MCHC: 34.9 g/dL (ref 30.0–36.0)
MCV: 88.4 fL (ref 80.0–100.0)
Platelets: 272 10*3/uL (ref 150–400)
RBC: 3.98 MIL/uL (ref 3.87–5.11)
RDW: 12.4 % (ref 11.5–15.5)
WBC: 6.6 10*3/uL (ref 4.0–10.5)
nRBC: 0 % (ref 0.0–0.2)

## 2022-04-26 LAB — URINALYSIS, ROUTINE W REFLEX MICROSCOPIC
Bilirubin Urine: NEGATIVE
Glucose, UA: 150 mg/dL — AB
Hgb urine dipstick: NEGATIVE
Ketones, ur: 20 mg/dL — AB
Leukocytes,Ua: NEGATIVE
Nitrite: NEGATIVE
Protein, ur: NEGATIVE mg/dL
Specific Gravity, Urine: 1.032 — ABNORMAL HIGH (ref 1.005–1.030)
pH: 6 (ref 5.0–8.0)

## 2022-04-26 LAB — TYPE AND SCREEN
ABO/RH(D): O POS
Antibody Screen: NEGATIVE

## 2022-04-26 LAB — TSH: TSH: 0.037 u[IU]/mL — ABNORMAL LOW (ref 0.350–4.500)

## 2022-04-26 MED ORDER — METOCLOPRAMIDE HCL 5 MG/ML IJ SOLN
10.0000 mg | Freq: Once | INTRAMUSCULAR | Status: AC
  Administered 2022-04-26: 10 mg via INTRAVENOUS
  Filled 2022-04-26: qty 2

## 2022-04-26 MED ORDER — ONDANSETRON 4 MG PO TBDP: 4.0000 mg | ORAL_TABLET | Freq: Three times a day (TID) | ORAL | Status: DC | PRN

## 2022-04-26 MED ORDER — HYDROXYZINE HCL 50 MG/ML IM SOLN
50.0000 mg | Freq: Four times a day (QID) | INTRAMUSCULAR | Status: DC | PRN
Start: 2022-04-26 — End: 2022-04-30

## 2022-04-26 MED ORDER — FAMOTIDINE 20 MG PO TABS
20.0000 mg | ORAL_TABLET | Freq: Two times a day (BID) | ORAL | Status: DC
  Administered 2022-04-26 – 2022-04-30 (×6): 20 mg via ORAL
  Filled 2022-04-26 (×6): qty 1

## 2022-04-26 MED ORDER — FAMOTIDINE IN NACL 20-0.9 MG/50ML-% IV SOLN
20.0000 mg | Freq: Two times a day (BID) | INTRAVENOUS | Status: DC
  Administered 2022-04-27 – 2022-04-28 (×2): 20 mg via INTRAVENOUS
  Filled 2022-04-26 (×4): qty 50

## 2022-04-26 MED ORDER — LACTATED RINGERS IV SOLN
INTRAVENOUS | Status: DC
  Administered 2022-04-27: 125 mL/h via INTRAVENOUS

## 2022-04-26 MED ORDER — METOCLOPRAMIDE HCL 10 MG PO TABS
10.0000 mg | ORAL_TABLET | Freq: Four times a day (QID) | ORAL | Status: DC
  Administered 2022-04-27 – 2022-04-30 (×9): 10 mg via ORAL
  Filled 2022-04-26 (×9): qty 1

## 2022-04-26 MED ORDER — CEFTRIAXONE SODIUM 2 G IJ SOLR
2.0000 g | Freq: Once | INTRAMUSCULAR | Status: AC
Start: 2022-04-26 — End: 2022-04-26
  Administered 2022-04-26: 2 g via INTRAVENOUS
  Filled 2022-04-26: qty 20

## 2022-04-26 MED ORDER — THIAMINE HCL 100 MG/ML IJ SOLN
100.0000 mg | Freq: Every day | INTRAMUSCULAR | Status: DC
  Administered 2022-04-26 – 2022-04-29 (×4): 100 mg via INTRAVENOUS
  Filled 2022-04-26 (×5): qty 1

## 2022-04-26 MED ORDER — FAMOTIDINE IN NACL 20-0.9 MG/50ML-% IV SOLN
20.0000 mg | Freq: Once | INTRAVENOUS | Status: AC
  Administered 2022-04-26: 20 mg via INTRAVENOUS

## 2022-04-26 MED ORDER — SODIUM CHLORIDE 0.9 % IV SOLN
2.0000 g | INTRAVENOUS | Status: AC
  Administered 2022-04-27 – 2022-04-28 (×2): 2 g via INTRAVENOUS
  Filled 2022-04-26 (×2): qty 20

## 2022-04-26 MED ORDER — DOCUSATE SODIUM 100 MG PO CAPS
100.0000 mg | ORAL_CAPSULE | Freq: Every day | ORAL | Status: DC
  Administered 2022-04-26 – 2022-04-29 (×4): 100 mg via ORAL
  Filled 2022-04-26 (×4): qty 1

## 2022-04-26 MED ORDER — DOXYLAMINE SUCCINATE (SLEEP) 25 MG PO TABS
25.0000 mg | ORAL_TABLET | Freq: Every evening | ORAL | Status: DC | PRN
Start: 2022-04-26 — End: 2022-04-30
  Administered 2022-04-26 – 2022-04-27 (×2): 25 mg via ORAL
  Filled 2022-04-26 (×2): qty 1

## 2022-04-26 MED ORDER — ONDANSETRON HCL 4 MG/2ML IJ SOLN
4.0000 mg | Freq: Three times a day (TID) | INTRAMUSCULAR | Status: DC | PRN
  Administered 2022-04-29: 4 mg via INTRAVENOUS
  Filled 2022-04-26: qty 2

## 2022-04-26 MED ORDER — MELATONIN 3 MG PO TABS
3.0000 mg | ORAL_TABLET | Freq: Every evening | ORAL | Status: DC | PRN
Start: 2022-04-26 — End: 2022-04-30

## 2022-04-26 MED ORDER — HYDROXYZINE HCL 50 MG PO TABS
50.0000 mg | ORAL_TABLET | Freq: Four times a day (QID) | ORAL | Status: DC | PRN
  Administered 2022-04-26 – 2022-04-27 (×2): 50 mg via ORAL
  Filled 2022-04-26 (×3): qty 1

## 2022-04-26 MED ORDER — SODIUM CHLORIDE 0.9 % IV BOLUS
1000.0000 mL | Freq: Once | INTRAVENOUS | Status: AC
  Administered 2022-04-26: 1000 mL via INTRAVENOUS

## 2022-04-26 MED ORDER — METOCLOPRAMIDE HCL 5 MG/ML IJ SOLN
10.0000 mg | Freq: Four times a day (QID) | INTRAMUSCULAR | Status: DC
  Administered 2022-04-26 – 2022-04-29 (×7): 10 mg via INTRAVENOUS
  Filled 2022-04-26 (×7): qty 2

## 2022-04-26 MED ORDER — SODIUM CHLORIDE 0.9 % IV SOLN: 8.0000 mg | Freq: Three times a day (TID) | INTRAVENOUS | Status: DC | PRN

## 2022-04-26 NOTE — H&P (Signed)
Antepartum History and Physical   Destiny Keith is a 36 y.o. female [phone] that presented for ongoing hyperemesis. Patient reports Several weeks of nausea and vomiting and PO intolerance. She has had mulitple visits to MAU for the past month for this issue.  She was seen for scheduled OB visit and given persistent symptoms, advised to present for observation, IVF, medication adjustments.   She has been doing home IV fluid kits. Reglan has been her most helpful antiemetic.   Pregnancy history is notable for IUGR with prior pregnancy.   UTI was detected on 8/7 for E coli. She was prescribed antibiotics but unable to keep down. A repeat urine culture was collected in office today 8/10.    OB History     Gravida  4   Para  1   Term  1   Preterm      AB  2   Living  1      SAB  1   IAB  1   Ectopic      Multiple  0   Live Births  1          Past Medical History:  Diagnosis Date   Genital warts    Headache    Ovarian cyst    Past Surgical History:  Procedure Laterality Date   NO PAST SURGERIES     Family History: family history includes Diabetes in her maternal grandmother; Hypertension in her maternal grandmother; Thyroid disease in her maternal grandmother. Social History:  reports that she has never smoked. She has never used smokeless tobacco. She reports that she does not currently use alcohol. She reports that she does not use drugs.     Maternal Diabetes: No Maternal Substance Abuse:  No Significant Maternal Medications:  Reglan Significant Maternal Lab Results:  None Other Comments:  None  Review of Systems History   Blood pressure 107/86, pulse (!) 114, temperature 98.3 F (36.8 C), temperature source Oral, resp. rate 17, height '5\' 2"'$  (1.575 m), weight 65.9 kg, last menstrual period 02/08/2022, SpO2 99 %, unknown if currently breastfeeding. Exam Physical Exam   Gen: alert, well appearing, no distress Chest: nonlabored breathing CV: no  peripheral edema Abdomen: soft, nontender Ext: no evidence of DVT   Assessment/Plan: Admit to [facility] for hyperemesis IVF bolus provided in MAU Thiamine and maintenance IVF after bolus NPO CMP, CBC, mag collected Begin with Reglan, Zofran, pepcid Daily dopp tones DVT Ppx: SCDs Plan for slow PO challenge when nausea has improved.  Destiny Keith 04/26/2022, 3:46 PM

## 2022-04-26 NOTE — MAU Provider Note (Signed)
History     [id number]  Arrival date and time: 04/26/22 1355    Chief Complaint  Patient presents with   Nausea   Emesis     HPI Destiny Keith is a 36 y.o. at 71w0dwho presents for management of hyperemesis. Sent from the office for probably admission. States she has vomited 5 times today. Took reglan this morning. Reports 17 lb weight loss with this pregnancy. Recently diagnosed with UTI. Hasn't been able to keep down all of her doses of antibiotics. Denies dysuria, fever, or flank pain.  Denies abdominal pain or vaginal bleeding.    OB History     Gravida  4   Para  1   Term  1   Preterm      AB  2   Living  1      SAB  1   IAB  1   Ectopic      Multiple  0   Live Births  1           Past Medical History:  Diagnosis Date   Genital warts    Headache    Ovarian cyst     Past Surgical History:  Procedure Laterality Date   NO PAST SURGERIES      Family History  Problem Relation Age of Onset   Diabetes Maternal Grandmother    Hypertension Maternal Grandmother    Thyroid disease Maternal Grandmother     No Known Allergies  No current facility-administered medications on file prior to encounter.   Current Outpatient Medications on File Prior to Encounter  Medication Sig Dispense Refill   cefadroxil (DURICEF) 500 MG capsule Take 1 capsule (500 mg total) by mouth 2 (two) times daily for 7 days. 14 capsule 0   metoCLOPramide (REGLAN) 10 MG tablet Take 1 tablet (10 mg total) by mouth every 8 (eight) hours as needed for nausea. 30 tablet 0   promethazine (PHENERGAN) 25 MG tablet Take 1 tablet (25 mg total) by mouth every 8 (eight) hours as needed for refractory nausea / vomiting. 30 tablet 0   acetaminophen (TYLENOL) 325 MG tablet Take 650 mg by mouth every 6 (six) hours as needed for mild pain. (Patient not taking: Reported on 01/02/2022)     famotidine (PEPCID) 20 MG tablet Take 1 tablet (20 mg total) by mouth daily. 30 tablet 0    fluticasone (FLONASE) 50 MCG/ACT nasal spray Place 2 sprays into both nostrils daily. 16 g 6   predniSONE (DELTASONE) 10 MG tablet Take by mouth.     scopolamine (TRANSDERM-SCOP) 1 MG/3DAYS Place 1 patch (1.5 mg total) onto the skin every 3 (three) days. 10 patch 0     ROS Pertinent positives and negative per HPI, all others reviewed and negative  Physical Exam   BP 107/86   Pulse (!) 114   Temp 98.3 F (36.8 C) (Oral)   Resp 17   Ht '5\' 2"'$  (1.575 m)   Wt 65.9 kg   LMP 02/08/2022   SpO2 99%   BMI 26.56 kg/m   Patient Vitals for the past 24 hrs:  BP Temp Temp src Pulse Resp SpO2 Height Weight  04/26/22 1449 -- -- -- -- -- -- '5\' 2"'$  (1.575 m) 65.9 kg  04/26/22 1433 107/86 -- -- (!) 114 -- -- -- --  04/26/22 1403 126/78 98.3 F (36.8 C) Oral 79 17 99 % -- --    Physical Exam Vitals and nursing note reviewed.  Constitutional:  General: She is not in acute distress.    Appearance: Normal appearance.  HENT:     Head: Normocephalic and atraumatic.  Pulmonary:     Effort: Pulmonary effort is normal. No respiratory distress.  Abdominal:     Tenderness: There is no right CVA tenderness or left CVA tenderness.  Skin:    General: Skin is warm and dry.  Neurological:     Mental Status: She is alert.  Psychiatric:        Mood and Affect: Mood normal.        Behavior: Behavior normal.      Labs Results for orders placed or performed during the hospital encounter of 04/26/22 (from the past 24 hour(s))  Urinalysis, Routine w reflex microscopic Urine, Clean Catch     Status: Abnormal   Collection Time: 04/26/22  2:20 PM  Result Value Ref Range   Color, Urine YELLOW YELLOW   APPearance CLOUDY (A) CLEAR   Specific Gravity, Urine 1.032 (H) 1.005 - 1.030   pH 6.0 5.0 - 8.0   Glucose, UA 150 (A) NEGATIVE mg/dL   Hgb urine dipstick NEGATIVE NEGATIVE   Bilirubin Urine NEGATIVE NEGATIVE   Ketones, ur 20 (A) NEGATIVE mg/dL   Protein, ur NEGATIVE NEGATIVE mg/dL   Nitrite  NEGATIVE NEGATIVE   Leukocytes,Ua NEGATIVE NEGATIVE    Imaging No results found.  MAU Course  Procedures Lab Orders         Urinalysis, Routine w reflex microscopic Urine, Clean Catch         CBC         Comprehensive metabolic panel         TSH         Magnesium     Meds ordered this encounter  Medications   sodium chloride 0.9 % bolus 1,000 mL   cefTRIAXone (ROCEPHIN) 2 g in sodium chloride 0.9 % 100 mL IVPB    Order Specific Question:   Antibiotic Indication:    Answer:   UTI   famotidine (PEPCID) IVPB 20 mg premix   metoCLOPramide (REGLAN) injection 10 mg   lactated ringers infusion   OR Linked Order Group    metoCLOPramide (REGLAN) tablet 10 mg    metoCLOPramide (REGLAN) injection 10 mg   OR Linked Order Group    famotidine (PEPCID) tablet 20 mg    famotidine (PEPCID) IVPB 20 mg premix   thiamine (VITAMIN B1) injection 100 mg   OR Linked Order Group    ondansetron (ZOFRAN-ODT) disintegrating tablet 4-8 mg    ondansetron (ZOFRAN) injection 4 mg    ondansetron (ZOFRAN) 8 mg in sodium chloride 0.9 % 50 mL IVPB   OR Linked Order Group    hydrOXYzine (ATARAX) tablet 50 mg    hydrOXYzine (VISTARIL) injection 50 mg   Imaging Orders  No imaging studies ordered today    MDM Labs ordered.  IV started - given dose of rocephin since not tolerating oral abx for UTI.  Reglan & pepcid ordered.   FHT present via doppler  Dr. Mardelle Matte aware of patient & will place orders for observation on OBSC unit Assessment and Plan   1. Hyperemesis affecting pregnancy, antepartum  -Reglan & pepcid ordered -Dr. Mardelle Matte to place admission orders  2. Urinary tract infection in mother during first trimester of pregnancy  -IV rocephin 2 gm given  3. [redacted] weeks gestation of pregnancy      Jorje Guild, NP 04/26/22 3:18 PM

## 2022-04-26 NOTE — MAU Note (Signed)
.  Destiny Keith is a 36 y.o. at 86w0dhere in MAU reporting: was sent for evaluation for nausea and vomiting. She has been unable to keep anything down, has lost 17lbs since the beginning of her pregnancy. Patient also has a UTI and is concerned that she cannot keep her antibiotics down. Denies VB or abnormal discharge.   Pain score: 0 Vitals:   04/26/22 1403  BP: 126/78  Pulse: 79  Resp: 17  Temp: 98.3 F (36.8 C)  SpO2: 99%     FHT:159 Lab orders placed from triage:  UA

## 2022-04-27 LAB — T4, FREE: Free T4: 0.95 ng/dL (ref 0.61–1.12)

## 2022-04-27 LAB — COMPREHENSIVE METABOLIC PANEL
ALT: 20 U/L (ref 0–44)
AST: 14 U/L — ABNORMAL LOW (ref 15–41)
Albumin: 2.5 g/dL — ABNORMAL LOW (ref 3.5–5.0)
Alkaline Phosphatase: 40 U/L (ref 38–126)
Anion gap: 5 (ref 5–15)
BUN: <5 mg/dL — ABNORMAL LOW (ref 6–20)
CO2: 23 mmol/L (ref 22–32)
Calcium: 8.3 mg/dL — ABNORMAL LOW (ref 8.9–10.3)
Chloride: 105 mmol/L (ref 98–111)
Creatinine, Ser: 0.53 mg/dL (ref 0.44–1.00)
GFR, Estimated: >60 mL/min (ref >60)
Glucose, Bld: 86 mg/dL (ref 70–99)
Potassium: 3.3 mmol/L — ABNORMAL LOW (ref 3.5–5.1)
Sodium: 133 mmol/L — ABNORMAL LOW (ref 135–145)
Total Bilirubin: 0.4 mg/dL (ref 0.3–1.2)
Total Protein: 4.5 g/dL — ABNORMAL LOW (ref 6.5–8.1)

## 2022-04-27 MED ORDER — SODIUM CHLORIDE 0.9 % IV SOLN
25.0000 mg | Freq: Four times a day (QID) | INTRAVENOUS | Status: DC | PRN
  Administered 2022-04-27: 25 mg via INTRAVENOUS
  Filled 2022-04-27 (×2): qty 1

## 2022-04-27 MED ORDER — FENTANYL CITRATE (PF) 100 MCG/2ML IJ SOLN
100.0000 ug | Freq: Once | INTRAMUSCULAR | Status: AC
  Administered 2022-04-27: 100 ug via INTRAVENOUS
  Filled 2022-04-27: qty 2

## 2022-04-27 MED ORDER — ACETAMINOPHEN 325 MG PO TABS
650.0000 mg | ORAL_TABLET | Freq: Four times a day (QID) | ORAL | Status: DC | PRN
Start: 2022-04-27 — End: 2022-04-30
  Administered 2022-04-27 – 2022-04-29 (×3): 650 mg via ORAL
  Filled 2022-04-27 (×3): qty 2

## 2022-04-27 MED ORDER — SODIUM CHLORIDE 0.9 % IV SOLN
INTRAVENOUS | Status: DC
  Administered 2022-04-27 – 2022-04-28 (×2): 125 mL/h via INTRAVENOUS

## 2022-04-27 NOTE — Progress Notes (Signed)
Patient ID: Destiny Keith, female   DOB: 1986-07-04, 36 y.o.   MRN: [id number] Feeling better from nausea Able to keep some food and fluids down Urine still concentrated VSSAF UOP today 700cc Still positive balance last 24 h  Abd nt Neg homans  IUP at 11 weeks Hyperemesis  - cont with IVF and will try to change reglan to po later today Increase diet HA -hx of this. Tylenol and will try some caffeine for now.  Not severe

## 2022-04-27 NOTE — Plan of Care (Signed)
  Problem: Activity: Goal: Risk for activity intolerance will decrease Outcome: Completed/Met   Problem: Elimination: Goal: Will not experience complications related to urinary retention Outcome: Completed/Met   Problem: Elimination: Goal: Will not experience complications related to urinary retention Outcome: Completed/Met

## 2022-04-28 DIAGNOSIS — Z3A11 11 weeks gestation of pregnancy: Secondary | ICD-10-CM | POA: Diagnosis not present

## 2022-04-28 DIAGNOSIS — O210 Mild hyperemesis gravidarum: Secondary | ICD-10-CM | POA: Diagnosis present

## 2022-04-28 DIAGNOSIS — O2341 Unspecified infection of urinary tract in pregnancy, first trimester: Secondary | ICD-10-CM | POA: Diagnosis present

## 2022-04-28 DIAGNOSIS — O99351 Diseases of the nervous system complicating pregnancy, first trimester: Secondary | ICD-10-CM | POA: Diagnosis present

## 2022-04-28 DIAGNOSIS — O211 Hyperemesis gravidarum with metabolic disturbance: Secondary | ICD-10-CM | POA: Diagnosis present

## 2022-04-28 DIAGNOSIS — G43909 Migraine, unspecified, not intractable, without status migrainosus: Secondary | ICD-10-CM | POA: Diagnosis present

## 2022-04-28 DIAGNOSIS — N390 Urinary tract infection, site not specified: Secondary | ICD-10-CM | POA: Diagnosis present

## 2022-04-28 NOTE — Progress Notes (Signed)
Patient ID: Destiny Keith, female   DOB: [birth year], 36 y.o.   MRN: [id number] Feeling much better this am Eating breakfast HA resolved  VSSAF  Abd nt nd  IUP at 11 2/7 Hyperemesis - Cont IVF and reglan Prn phenergan and zofran If able to eat and no N/V consider d/c home

## 2022-04-29 MED ORDER — BISACODYL 5 MG PO TBEC: 5.0000 mg | DELAYED_RELEASE_TABLET | Freq: Every day | ORAL | Status: DC | PRN

## 2022-04-29 MED ORDER — BUTALBITAL-APAP-CAFFEINE 50-325-40 MG PO TABS: 1.0000 | ORAL_TABLET | ORAL | 0 refills | Status: DC | PRN

## 2022-04-29 MED ORDER — BUTALBITAL-APAP-CAFFEINE 50-325-40 MG PO TABS
1.0000 | ORAL_TABLET | ORAL | Status: DC | PRN
Start: 2022-04-29 — End: 2022-04-30
  Administered 2022-04-29: 1 via ORAL
  Filled 2022-04-29: qty 1

## 2022-04-29 MED ORDER — POLYETHYLENE GLYCOL 3350 17 G PO PACK
17.0000 g | PACK | Freq: Every day | ORAL | Status: DC | PRN
  Administered 2022-04-29: 17 g via ORAL
  Filled 2022-04-29: qty 1

## 2022-04-29 MED ORDER — METOCLOPRAMIDE HCL 10 MG PO TABS: 10.0000 mg | ORAL_TABLET | Freq: Three times a day (TID) | ORAL | 6 refills | Status: DC

## 2022-04-29 MED ORDER — DOCUSATE SODIUM 100 MG PO CAPS: 100.0000 mg | ORAL_CAPSULE | Freq: Three times a day (TID) | ORAL | Status: DC

## 2022-04-29 NOTE — Discharge Summary (Addendum)
Physician Discharge Summary  Patient ID: Destiny Keith MRN: [id number] DOB/AGE: 34/34/1987 34 y.o.  Admit date: 04/26/2022 Discharge date: 04/30/2022  Admission Diagnoses:hyperemesis gravidarum, dehydration  Discharge Diagnoses: same Principal Problem:   Hyperemesis affecting pregnancy, antepartum   Discharged Condition: good  Hospital Course: Destiny Keith admitted due to dehydration, intractable N/V.  She received IVF, antiemetics, and pain medicine for her migraine.  After 3 days was felt "best she has felt" in weeks and was able to tolerate po with emesis.  However, she then had a HA which was responsive to fioricet. She then felt well again and was able to tolerate 3 meals on HD#4. On HD#4 int he AM she was ready to be discharged. D/C home on reglan QID and fioricet for headaches and pepcid. Continue daily colace and miralax for constipation. Also has prn ducolax. Was given Rocephin for completion of UTI abx  Consults: None  Significant Diagnostic Studies: labs: TSH suppressed with normal T4.  Needs fu in office  Treatments: IV hydration, antibiotics: rocephin x 3 days, and analgesia: acetaminophen and fioricet (tried fentanyl but caused worse nausea)  Discharge Exam: Blood pressure 115/60, pulse 64, temperature 98.2 F (36.8 C), resp. rate 16, height '5\' 2"'$  (1.575 m), weight 68.1 kg, last menstrual period 02/08/2022, SpO2 98 %, unknown if currently breastfeeding. General appearance: alert, cooperative, appears stated age, and no distress GI: soft, non-tender; bowel sounds normal; no masses,  no organomegaly Skin: Skin color, texture, turgor normal. No rashes or lesions  Disposition: Discharge disposition: 01-Home or Self Care       Discharge Instructions     Call MD for:  difficulty breathing, headache or visual disturbances   Complete by: As directed    Call MD for:  persistant nausea and vomiting   Complete by: As directed    Call MD for:  redness, tenderness, or signs  of infection (pain, swelling, redness, odor or green/yellow discharge around incision site)   Complete by: As directed    Call MD for:  severe uncontrolled pain   Complete by: As directed    Call MD for:  temperature >100.4   Complete by: As directed    Diet general   Complete by: As directed    Discharge diet:  No restrictions   Complete by: As directed    No sexual activity restrictions   Complete by: As directed       Allergies as of 04/30/2022   No Known Allergies      Medication List     STOP taking these medications    cefadroxil 500 MG capsule Commonly known as: DURICEF   predniSONE 10 MG tablet Commonly known as: DELTASONE       TAKE these medications    acetaminophen 325 MG tablet Commonly known as: TYLENOL Take 650 mg by mouth every 6 (six) hours as needed for mild pain.   bisacodyl 5 MG EC tablet Commonly known as: DULCOLAX Take 1 tablet (5 mg total) by mouth daily as needed for moderate constipation.   butalbital-acetaminophen-caffeine 50-325-40 MG tablet Commonly known as: FIORICET Take 1 tablet by mouth every 4 (four) hours as needed for headache or migraine.   docusate sodium 100 MG capsule Commonly known as: COLACE Take 1 capsule (100 mg total) by mouth 2 (two) times daily.   famotidine 20 MG tablet Commonly known as: Pepcid Take 1 tablet (20 mg total) by mouth daily.   fluticasone 50 MCG/ACT nasal spray Commonly known as: FLONASE Place 2 sprays into both  nostrils daily.   metoCLOPramide 10 MG tablet Commonly known as: REGLAN Take 1 tablet (10 mg total) by mouth 4 (four) times daily -  before meals and at bedtime. What changed:  when to take this reasons to take this   polyethylene glycol 17 g packet Commonly known as: MIRALAX / GLYCOLAX Take 17 g by mouth daily as needed for mild constipation.   promethazine 25 MG tablet Commonly known as: PHENERGAN Take 1 tablet (25 mg total) by mouth every 8 (eight) hours as needed for  refractory nausea / vomiting.   scopolamine 1 MG/3DAYS Commonly known as: TRANSDERM-SCOP Place 1 patch (1.5 mg total) onto the skin every 3 (three) days.          Signed: Tyson Dense 04/30/2022, 8:21 AM

## 2022-04-30 MED ORDER — POLYETHYLENE GLYCOL 3350 17 G PO PACK: 17.0000 g | PACK | Freq: Every day | ORAL | 0 refills | Status: DC | PRN

## 2022-04-30 MED ORDER — DOCUSATE SODIUM 100 MG PO CAPS: 100.0000 mg | ORAL_CAPSULE | Freq: Two times a day (BID) | ORAL | 0 refills | Status: DC

## 2022-04-30 MED ORDER — BISACODYL 5 MG PO TBEC: 5.0000 mg | DELAYED_RELEASE_TABLET | Freq: Every day | ORAL | 0 refills | Status: DC | PRN

## 2022-05-02 ENCOUNTER — Other Ambulatory Visit: Payer: Self-pay

## 2022-05-15 ENCOUNTER — Other Ambulatory Visit: Payer: Self-pay

## 2022-05-16 ENCOUNTER — Other Ambulatory Visit: Payer: Self-pay | Admitting: Obstetrics and Gynecology

## 2022-05-16 ENCOUNTER — Other Ambulatory Visit: Payer: Self-pay

## 2022-05-16 DIAGNOSIS — Z363 Encounter for antenatal screening for malformations: Secondary | ICD-10-CM

## 2022-05-17 ENCOUNTER — Encounter: Payer: Self-pay | Admitting: *Deleted

## 2022-05-17 ENCOUNTER — Ambulatory Visit (HOSPITAL_BASED_OUTPATIENT_CLINIC_OR_DEPARTMENT_OTHER): Payer: BC Managed Care – PPO | Admitting: Obstetrics

## 2022-05-17 ENCOUNTER — Ambulatory Visit: Payer: BC Managed Care – PPO | Admitting: *Deleted

## 2022-05-17 ENCOUNTER — Ambulatory Visit: Payer: BC Managed Care – PPO | Attending: Obstetrics and Gynecology

## 2022-05-17 ENCOUNTER — Other Ambulatory Visit: Payer: Self-pay | Admitting: *Deleted

## 2022-05-17 VITALS — BP 117/62 | HR 86

## 2022-05-17 DIAGNOSIS — Z363 Encounter for antenatal screening for malformations: Secondary | ICD-10-CM | POA: Diagnosis not present

## 2022-05-17 DIAGNOSIS — Z3A14 14 weeks gestation of pregnancy: Secondary | ICD-10-CM

## 2022-05-17 DIAGNOSIS — O210 Mild hyperemesis gravidarum: Secondary | ICD-10-CM

## 2022-05-17 DIAGNOSIS — O211 Hyperemesis gravidarum with metabolic disturbance: Secondary | ICD-10-CM

## 2022-05-17 NOTE — Progress Notes (Signed)
MFM Note  Destiny Keith was seen for an ultrasound and consultation due to hyperemesis gravidarum.    The patient reports that due to persistent nausea and vomiting, she had to go to the hospital about twice a week for IV fluids and IV medications. She has lost about 20 pounds since the start of her current pregnancy.  She was seen in the MAU on April 27, 2022 where electrolyte abnormalities were noted.  The patient reports that over the past week or so, she has started to feel better with treatment using Reglan and Pepcid.  She is still vomiting up to 3 times per day.  However, she is able to tolerate a small amount of food.  She has noted a 2 pound weight gain this week.    She had been treated with Zofran, Phenergan, and a scopolamine patch in the past.  These medications were discontinued as they did not provide her with symptomatic relief.  She had a cell free DNA test drawn earlier in her current pregnancy which indicated a low risk for trisomy 71, 84, and 13.  A female fetus is predicted.  On today's exam, a viable singleton intrauterine gestation with an [facility] of November 15, 2022 was noted.  The fetal biometry measurements obtained today were consistent with this EDC.  The following were discussed during today's consultation:  Hyperemesis gravidarum  The patient was advised that many women will have relief from persistent nausea and vomiting symptoms once they enter the second trimester.  However, many women may continue to experience nausea and vomiting throughout their entire pregnancy.  As she is feeling better now, she was advised to continue taking the Reglan and Pepcid as prescribed.  She was advised to eat frequent small meals so that she may be able to better tolerate solid foods.  She was advised to drink fluids such as Gatorade, Pedialyte, or Vitamin Water to help replenish the electrolytes that she may lose when she vomits.  As she is feeling better and is able to  tolerate oral foods, she was reassured that there would be no need for a PICC line or parenteral nutrition such as TPN.  I had made arrangements for her to start 3 times a week outpatient IV therapy with IV fluids, IV anti-nausea medication ,and IV vitamin infusions at the [facility].  They are able to see her for 3 times a week outpatient visits starting on May 29, 2022.  The infusion center stated that they would allow her to keep an IV catheter (in an outpatient setting) in her arm from Monday through Friday.  They would place a new IV catheter at the begining of each week. This way she would not need a PICC line.  As the patient reports that she is feeling better, she has already booked a trip to [place] to visit her relatives starting on May 29, 2022.  She will be there for up to a month.  As she will be out of the country, I will cancel the appointments I made for her at the infusion center.  We can reschedule these appointments should she continue to complain of intolerable nausea and vomiting later in her pregnancy.  As she is unable to swallow prenatal vitamins, she was advised to chew gummy prenatal vitamins which are available over-the-counter at many pharmacies or on [email or web address].  A detailed fetal anatomy scan has been scheduled for her in our office at around 19 weeks.  Should she continue to complain of nausea and vomiting later in her pregnancy, she should be followed with monthly growth ultrasounds in your office following her fetal anatomy scan.  The patient is happy and comfortable with this management plan.  She will return to our office for a detailed fetal anatomy at 19 weeks.   She stated that all of her questions were answered today.  A total of 45 minutes was spent counseling and coordinating the care for this patient.  Greater than 50% of the time was spent in direct face-to-face contact.

## 2022-05-28 DIAGNOSIS — J029 Acute pharyngitis, unspecified: Secondary | ICD-10-CM | POA: Diagnosis not present

## 2022-05-28 DIAGNOSIS — J069 Acute upper respiratory infection, unspecified: Secondary | ICD-10-CM | POA: Diagnosis not present

## 2022-05-28 DIAGNOSIS — Z20822 Contact with and (suspected) exposure to covid-19: Secondary | ICD-10-CM | POA: Diagnosis not present

## 2022-05-29 ENCOUNTER — Encounter (HOSPITAL_COMMUNITY): Payer: BC Managed Care – PPO

## 2022-06-25 IMAGING — RF DG HYSTEROGRAM
1 series · 10 of 10 positions shown · IV contrast (omnipaque)
Comparison: None Available.

CLINICAL DATA: Secondary infertility.  Fibroids.

EXAM:
HYSTEROSALPINGOGRAM
TECHNIQUE: Following cleansing of the cervix and vagina with Betadine solution,
a hysterosalpingogram was performed using a 5-French
hysterosalpingogram catheter and Omnipaque 300 contrast. The patient
tolerated the examination without difficulty.

[Series 1: one shot · 10 of 10 slices shown]
[im 1/10]
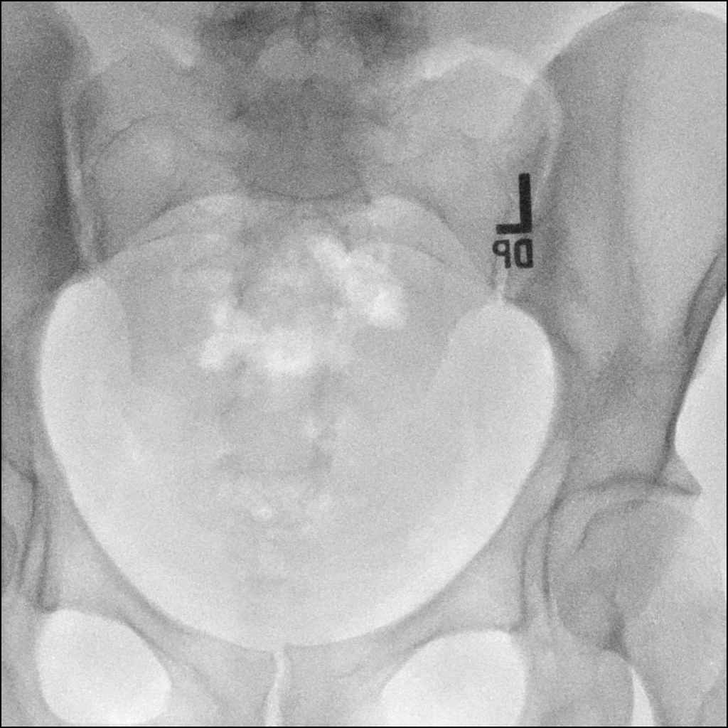
[im 2/10]
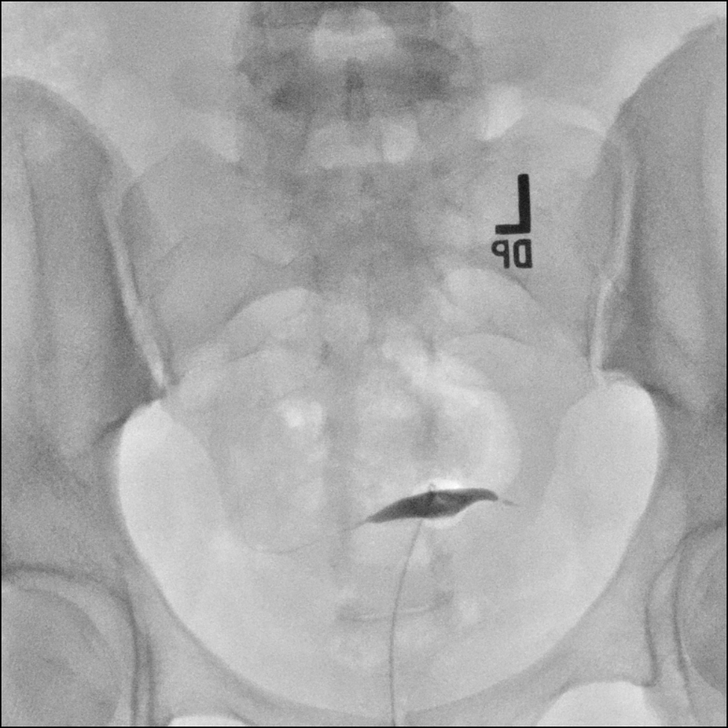
[im 3/10]
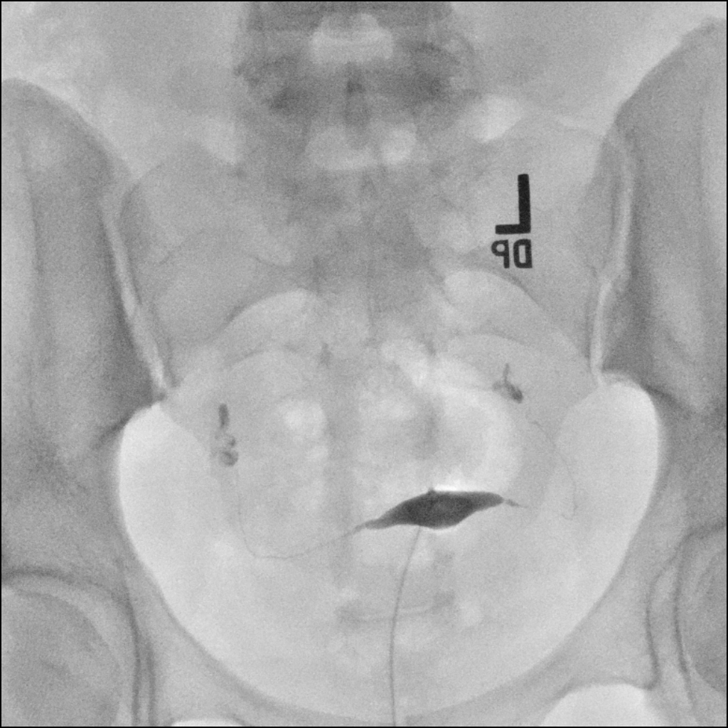
[im 4/10]
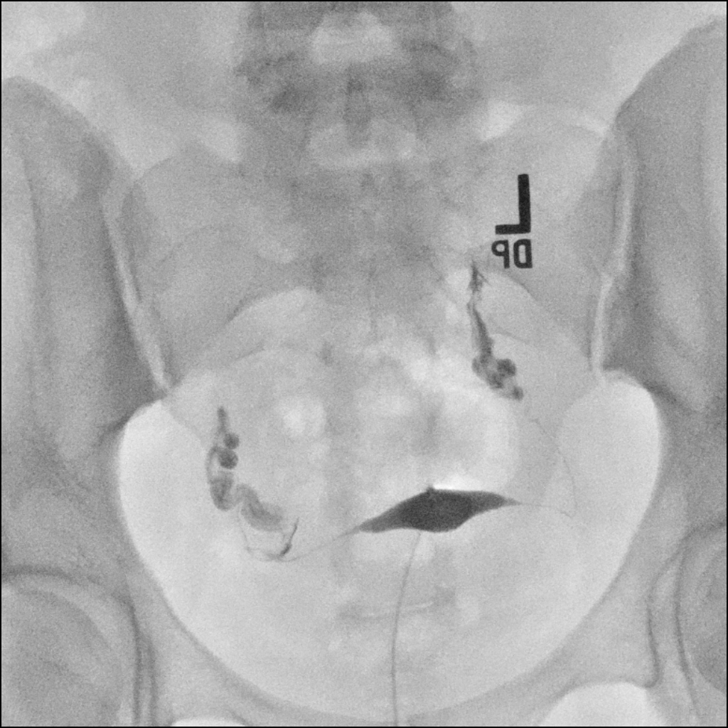
[im 5/10]
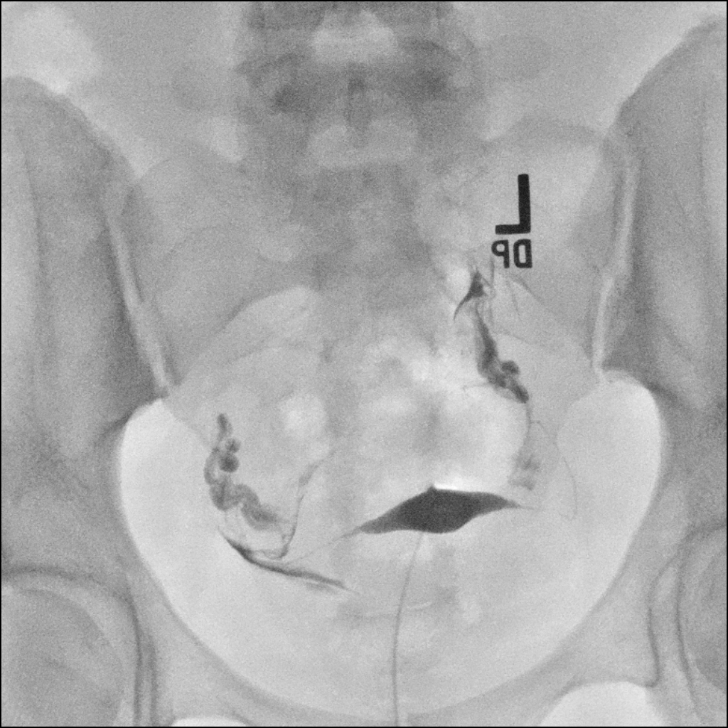
[im 6/10]
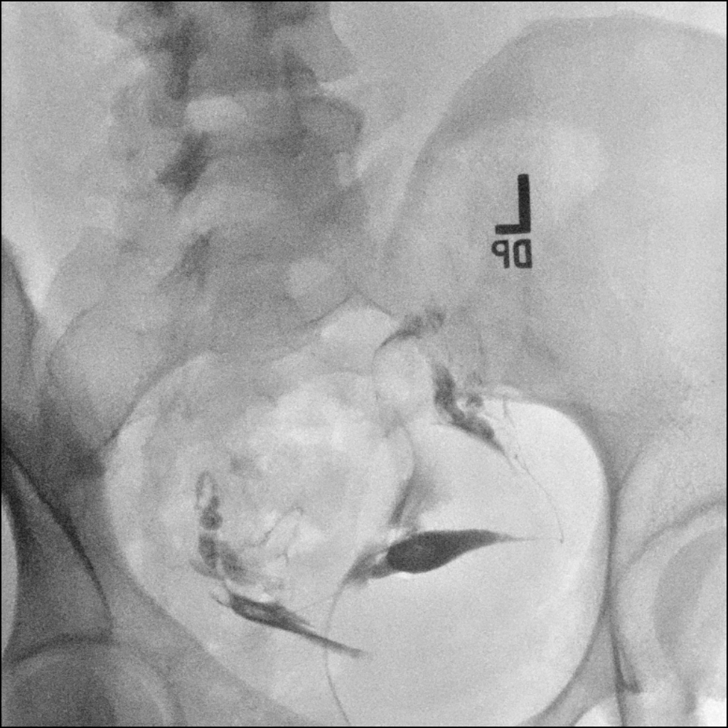
[im 7/10]
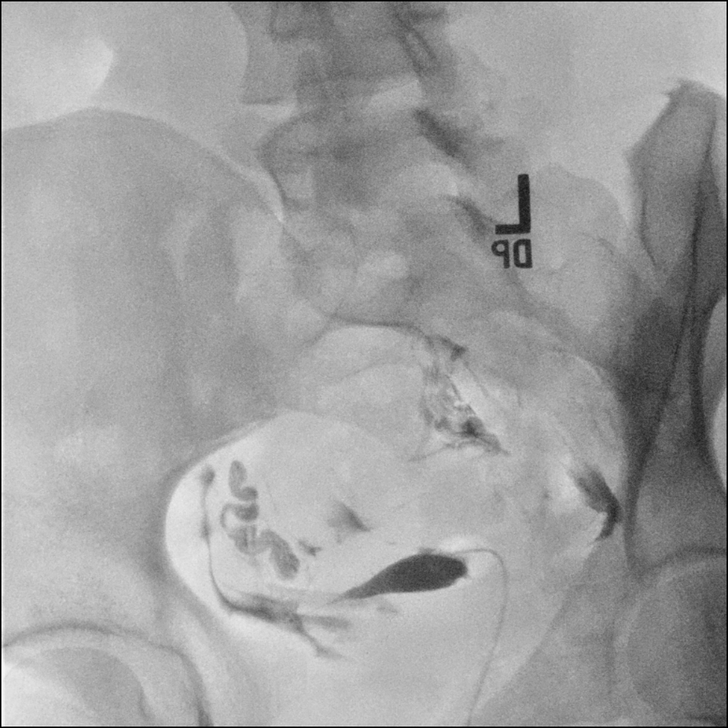
[im 8/10]
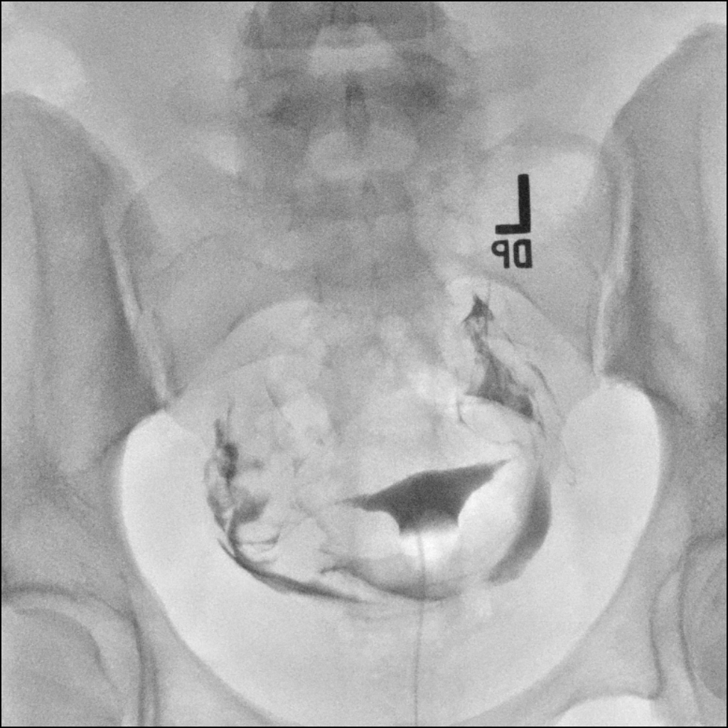
[im 9/10]
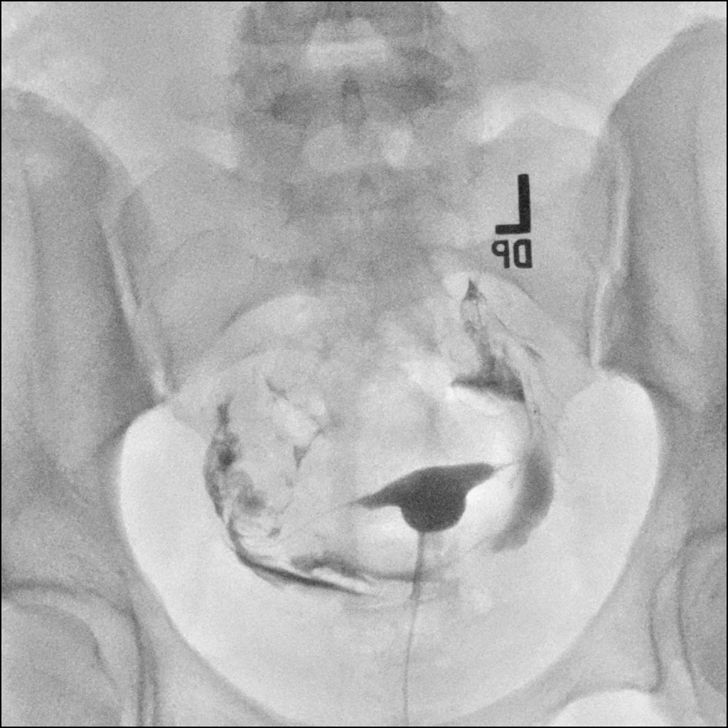
[im 10/10]
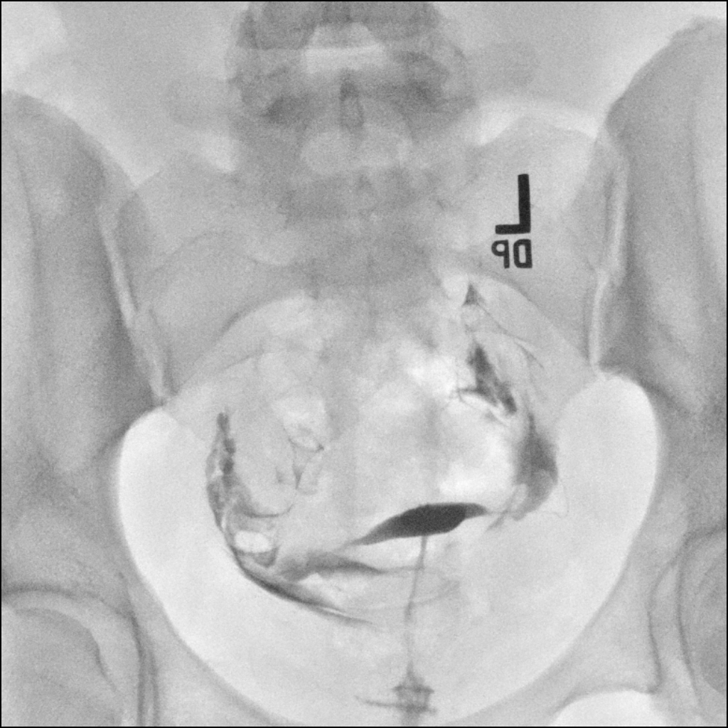

[10 of 10 positions shown; findings below may reference images not displayed]

FLUOROSCOPY:
Radiation Exposure Index (as provided by the fluoroscopic device):
12.5 mGy Kerma
FINDINGS: Endometrial Cavity: Normal appearance. No signs of Mullerian duct
anomaly or other significant abnormality.

Right Fallopian Tube: Normal appearance. Free intraperitoneal spill
of contrast is demonstrated.

Left Fallopian Tube: Normal appearance. Free intraperitoneal spill
of contrast is demonstrated.

Other:  None.
IMPRESSION: Normal study. Both Fallopian tubes are patent.

## 2022-06-27 ENCOUNTER — Ambulatory Visit: Payer: BC Managed Care – PPO | Admitting: *Deleted

## 2022-06-27 ENCOUNTER — Encounter: Payer: Self-pay | Admitting: *Deleted

## 2022-06-27 ENCOUNTER — Other Ambulatory Visit: Payer: Self-pay | Admitting: *Deleted

## 2022-06-27 ENCOUNTER — Ambulatory Visit: Payer: BC Managed Care – PPO | Attending: Obstetrics

## 2022-06-27 DIAGNOSIS — O210 Mild hyperemesis gravidarum: Secondary | ICD-10-CM

## 2022-06-27 DIAGNOSIS — O3412 Maternal care for benign tumor of corpus uteri, second trimester: Secondary | ICD-10-CM | POA: Diagnosis not present

## 2022-06-27 DIAGNOSIS — D259 Leiomyoma of uterus, unspecified: Secondary | ICD-10-CM

## 2022-06-27 DIAGNOSIS — Z3A19 19 weeks gestation of pregnancy: Secondary | ICD-10-CM

## 2022-06-27 DIAGNOSIS — O211 Hyperemesis gravidarum with metabolic disturbance: Secondary | ICD-10-CM | POA: Diagnosis not present

## 2022-06-28 DIAGNOSIS — Z23 Encounter for immunization: Secondary | ICD-10-CM | POA: Diagnosis not present

## 2022-07-04 DIAGNOSIS — A630 Anogenital (venereal) warts: Secondary | ICD-10-CM | POA: Diagnosis not present

## 2022-07-04 DIAGNOSIS — N9089 Other specified noninflammatory disorders of vulva and perineum: Secondary | ICD-10-CM | POA: Diagnosis not present

## 2022-07-30 ENCOUNTER — Ambulatory Visit: Payer: BC Managed Care – PPO | Attending: Obstetrics | Admitting: *Deleted

## 2022-07-30 ENCOUNTER — Ambulatory Visit (HOSPITAL_BASED_OUTPATIENT_CLINIC_OR_DEPARTMENT_OTHER): Payer: BC Managed Care – PPO

## 2022-07-30 VITALS — BP 123/62 | HR 89

## 2022-07-30 DIAGNOSIS — Z3A24 24 weeks gestation of pregnancy: Secondary | ICD-10-CM

## 2022-07-30 DIAGNOSIS — O210 Mild hyperemesis gravidarum: Secondary | ICD-10-CM

## 2022-07-30 DIAGNOSIS — O211 Hyperemesis gravidarum with metabolic disturbance: Secondary | ICD-10-CM | POA: Diagnosis not present

## 2022-07-30 DIAGNOSIS — Z362 Encounter for other antenatal screening follow-up: Secondary | ICD-10-CM

## 2022-07-30 DIAGNOSIS — O321XX0 Maternal care for breech presentation, not applicable or unspecified: Secondary | ICD-10-CM | POA: Insufficient documentation

## 2022-08-23 DIAGNOSIS — Z23 Encounter for immunization: Secondary | ICD-10-CM | POA: Diagnosis not present

## 2022-08-23 DIAGNOSIS — Z3480 Encounter for supervision of other normal pregnancy, unspecified trimester: Secondary | ICD-10-CM | POA: Diagnosis not present

## 2022-09-04 DIAGNOSIS — O99810 Abnormal glucose complicating pregnancy: Secondary | ICD-10-CM | POA: Diagnosis not present

## 2022-09-06 DIAGNOSIS — R102 Pelvic and perineal pain: Secondary | ICD-10-CM | POA: Diagnosis not present

## 2022-09-17 NOTE — L&D Delivery Note (Signed)
Operative Note  PROCEDURE DATE: 11/12/22   PREOPERATIVE DIAGNOSIS: BPP 2/10, [redacted]w[redacted]d  POSTOPERATIVE DIAGNOSIS: The same   PROCEDURE:    Primary Low Transverse Cesarean Section   SURGEON:  Dr. AAlpha GulaASSISTANT: Dr. CCaron Presume An experienced assistant was required given the standard of surgical care given the complexity of the case.  This assistant was needed for exposure, dissection, suctioning, retraction, instrument exchange, assisting with delivery with administration of fundal pressure, and for overall help during the procedure.    INDICATIONS: This is a 37y.o. yo [id number] 353w4dequiring cesarean section secondary to abnormal antenatal testing.   Patient was seen in the office and reported decreased fetal movement.  NST was done and was nonreactive.  BPP was then done with only points for amniotic fluid.  Thus, score was 2/10 for BPP.  We discussed trial of labor vs primary C section for suspected compromised placental function.  Discussed the risks and benefits of each, including additional risks of C section, but also progression of nonreassuring status with trial of labor.  Decision made to proceed with LTCS. The risks of cesarean section discussed with the patient included but were not limited to: bleeding which may require transfusion or reoperation; infection which may require antibiotics; injury to bowel, bladder, ureters or other surrounding organs; injury to the fetus; need for additional procedures including hysterectomy in the event of a life-threatening hemorrhage; placental abnormalities wth subsequent pregnancies, incisional problems, thromboembolic phenomenon and other postoperative/anesthesia complications. The patient agreed with the proposed plan, giving informed consent for the procedure.     FINDINGS:  Viable female infant in vertex presentation, APGARs pend,  Weight pending, Amniotic fluid clear,  Intact placenta with fibrin deposits, calcifications.  Three  vessel cord.  Grossly normal uterus  .   ANESTHESIA:    Epidural ESTIMATED BLOOD LOSS: 444 cc SPECIMENS: Placenta for pathology COMPLICATIONS: None immediate    PROCEDURE IN DETAIL:  The patient received intravenous antibiotics (2g Ancef) and had sequential compression devices applied to her lower extremities while in the preoperative area.  She was then taken to the operating room where epidural anesthesia was dosed up to surgical level and was found to be adequate. She was then placed in a dorsal supine position with a leftward tilt, and prepped and draped in a sterile manner.  A foley catheter was placed into her bladder and attached to constant gravity.  After an adequate timeout was performed, a Pfannenstiel skin incision was made with scalpel and carried through to the underlying layer of fascia. The fascia was incised in the midline and this incision was extended bilaterally using the Mayo scissors. Kocher clamps were applied to the superior aspect of the fascial incision and the underlying rectus muscles were dissected off bluntly. A similar process was carried out on the inferior aspect of the facial incision. The rectus muscles were separated in the midline bluntly and the peritoneum was entered bluntly.  A bladder flap was created sharply and developed bluntly. A transverse hysterotomy was made with a scalpel and extended bilaterally bluntly. The bladder blade was then removed. The infant was successfully delivered, and cord was clamped and cut and infant was handed over to awaiting neonatology team. Uterine massage was then administered and the placenta delivered intact with three-vessel cord. The uterus was cleared of clot and debris.  The hysterotomy was closed with 0 monocryl.  A second imbricating suture of 0-monocryl was used to reinforce the incision and aid in hemostasis.The fascia  was closed with 0-PDS in a running fashion with good restoration of anatomy.  The subcutaneus tissue was  irrigated and was reapproximated using running plain gut.  The skin was closed with 4-0 Vicryl in a subcuticular fashion.  All surgical site and was hemostatic at end of procedure without any further bleeding on exam.    Pt tolerated the procedure well. All sponge/lap/needle counts were correct  X 2. Pt taken to recovery room in stable condition.     Alpha Gula MD

## 2022-09-19 DIAGNOSIS — Z3A31 31 weeks gestation of pregnancy: Secondary | ICD-10-CM | POA: Diagnosis not present

## 2022-09-19 DIAGNOSIS — Z8759 Personal history of other complications of pregnancy, childbirth and the puerperium: Secondary | ICD-10-CM | POA: Diagnosis not present

## 2022-10-17 DIAGNOSIS — D509 Iron deficiency anemia, unspecified: Secondary | ICD-10-CM | POA: Diagnosis not present

## 2022-10-24 DIAGNOSIS — Z3685 Encounter for antenatal screening for Streptococcus B: Secondary | ICD-10-CM | POA: Diagnosis not present

## 2022-11-07 DIAGNOSIS — Z3A38 38 weeks gestation of pregnancy: Secondary | ICD-10-CM | POA: Diagnosis not present

## 2022-11-07 DIAGNOSIS — O368130 Decreased fetal movements, third trimester, not applicable or unspecified: Secondary | ICD-10-CM | POA: Diagnosis not present

## 2022-11-12 ENCOUNTER — Inpatient Hospital Stay (HOSPITAL_COMMUNITY)
Admission: AD | Admit: 2022-11-12 | Discharge: 2022-11-15 | DRG: 788 | Disposition: A | Discharge status: Home / Self Care | Payer: BC Managed Care – PPO | Attending: Obstetrics and Gynecology | Admitting: Obstetrics and Gynecology

## 2022-11-12 ENCOUNTER — Inpatient Hospital Stay (HOSPITAL_COMMUNITY): Payer: BC Managed Care – PPO | Admitting: Anesthesiology

## 2022-11-12 ENCOUNTER — Encounter (HOSPITAL_COMMUNITY)
Admission: AD | Disposition: A | Discharge status: Home / Self Care | Payer: Self-pay | Source: Home / Self Care | Attending: Obstetrics and Gynecology

## 2022-11-12 ENCOUNTER — Encounter (HOSPITAL_COMMUNITY): Payer: Self-pay | Admitting: Obstetrics and Gynecology

## 2022-11-12 DIAGNOSIS — Z3490 Encounter for supervision of normal pregnancy, unspecified, unspecified trimester: Principal | ICD-10-CM

## 2022-11-12 DIAGNOSIS — O368130 Decreased fetal movements, third trimester, not applicable or unspecified: Principal | ICD-10-CM | POA: Diagnosis present

## 2022-11-12 DIAGNOSIS — Z3A39 39 weeks gestation of pregnancy: Secondary | ICD-10-CM | POA: Diagnosis not present

## 2022-11-12 DIAGNOSIS — Z23 Encounter for immunization: Secondary | ICD-10-CM | POA: Diagnosis not present

## 2022-11-12 DIAGNOSIS — Z412 Encounter for routine and ritual male circumcision: Secondary | ICD-10-CM | POA: Diagnosis not present

## 2022-11-12 DIAGNOSIS — O365930 Maternal care for other known or suspected poor fetal growth, third trimester, not applicable or unspecified: Secondary | ICD-10-CM | POA: Diagnosis not present

## 2022-11-12 DIAGNOSIS — O26893 Other specified pregnancy related conditions, third trimester: Secondary | ICD-10-CM | POA: Diagnosis not present

## 2022-11-12 HISTORY — DX: Urinary tract infection, site not specified: N39.0

## 2022-11-12 HISTORY — DX: Benign neoplasm of connective and other soft tissue, unspecified: D21.9

## 2022-11-12 LAB — CBC
HCT: 37.8 % (ref 36.0–46.0)
Hemoglobin: 12.6 g/dL (ref 12.0–15.0)
MCH: 30.1 pg (ref 26.0–34.0)
MCHC: 33.3 g/dL (ref 30.0–36.0)
MCV: 90.2 fL (ref 80.0–100.0)
Platelets: 207 10*3/uL (ref 150–400)
RBC: 4.19 MIL/uL (ref 3.87–5.11)
RDW: 14.4 % (ref 11.5–15.5)
WBC: 6.3 10*3/uL (ref 4.0–10.5)
nRBC: 0 % (ref 0.0–0.2)

## 2022-11-12 LAB — PROTEIN / CREATININE RATIO, URINE
Creatinine, Urine: 56 mg/dL
Protein Creatinine Ratio: 0.34 mg/mg{Cre} — ABNORMAL HIGH (ref 0.00–0.15)
Total Protein, Urine: 19 mg/dL

## 2022-11-12 LAB — TYPE AND SCREEN
ABO/RH(D): O POS
Antibody Screen: NEGATIVE

## 2022-11-12 LAB — COMPREHENSIVE METABOLIC PANEL
ALT: 20 U/L (ref 0–44)
AST: 20 U/L (ref 15–41)
Albumin: 2.6 g/dL — ABNORMAL LOW (ref 3.5–5.0)
Alkaline Phosphatase: 183 U/L — ABNORMAL HIGH (ref 38–126)
Anion gap: 7 (ref 5–15)
BUN: 9 mg/dL (ref 6–20)
CO2: 22 mmol/L (ref 22–32)
Calcium: 9.2 mg/dL (ref 8.9–10.3)
Chloride: 105 mmol/L (ref 98–111)
Creatinine, Ser: 0.52 mg/dL (ref 0.44–1.00)
GFR, Estimated: >60 mL/min (ref >60)
Glucose, Bld: 90 mg/dL (ref 70–99)
Potassium: 3.9 mmol/L (ref 3.5–5.1)
Sodium: 134 mmol/L — ABNORMAL LOW (ref 135–145)
Total Bilirubin: 0.2 mg/dL — ABNORMAL LOW (ref 0.3–1.2)
Total Protein: 6.2 g/dL — ABNORMAL LOW (ref 6.5–8.1)

## 2022-11-12 LAB — HIV ANTIBODY (ROUTINE TESTING W REFLEX): HIV Screen 4th Generation wRfx: NONREACTIVE

## 2022-11-12 SURGERY — Surgical Case
Anesthesia: Spinal

## 2022-11-12 MED ORDER — SENNOSIDES-DOCUSATE SODIUM 8.6-50 MG PO TABS
2.0000 | ORAL_TABLET | Freq: Every day | ORAL | Status: DC
  Administered 2022-11-13 – 2022-11-15 (×3): 2 via ORAL
  Filled 2022-11-12 (×3): qty 2

## 2022-11-12 MED ORDER — OXYCODONE HCL 5 MG/5ML PO SOLN: 5.0000 mg | Freq: Once | ORAL | Status: DC | PRN

## 2022-11-12 MED ORDER — BUPIVACAINE IN DEXTROSE 0.75-8.25 % IT SOLN
INTRATHECAL | Status: DC | PRN
  Administered 2022-11-12: 1.4 mL via INTRATHECAL

## 2022-11-12 MED ORDER — ONDANSETRON HCL 4 MG/2ML IJ SOLN
INTRAMUSCULAR | Status: DC | PRN
  Administered 2022-11-12: 4 mg via INTRAVENOUS

## 2022-11-12 MED ORDER — MORPHINE SULFATE (PF) 0.5 MG/ML IJ SOLN
INTRAMUSCULAR | Status: DC | PRN
  Administered 2022-11-12: 150 ug via INTRATHECAL

## 2022-11-12 MED ORDER — KETOROLAC TROMETHAMINE 30 MG/ML IJ SOLN: 30.0000 mg | Freq: Four times a day (QID) | INTRAMUSCULAR | Status: DC | PRN

## 2022-11-12 MED ORDER — ONDANSETRON HCL 4 MG/2ML IJ SOLN: 4.0000 mg | Freq: Three times a day (TID) | INTRAMUSCULAR | Status: DC | PRN

## 2022-11-12 MED ORDER — OXYTOCIN-SODIUM CHLORIDE 30-0.9 UT/500ML-% IV SOLN
INTRAVENOUS | Status: AC
  Filled 2022-11-12: qty 500

## 2022-11-12 MED ORDER — KETOROLAC TROMETHAMINE 30 MG/ML IJ SOLN
INTRAMUSCULAR | Status: AC
  Filled 2022-11-12: qty 1

## 2022-11-12 MED ORDER — LACTATED RINGERS IV BOLUS
1000.0000 mL | Freq: Once | INTRAVENOUS | Status: AC
  Administered 2022-11-12: 1000 mL via INTRAVENOUS

## 2022-11-12 MED ORDER — SIMETHICONE 80 MG PO CHEW
80.0000 mg | CHEWABLE_TABLET | Freq: Three times a day (TID) | ORAL | Status: DC
  Administered 2022-11-13 – 2022-11-15 (×7): 80 mg via ORAL
  Filled 2022-11-12 (×6): qty 1

## 2022-11-12 MED ORDER — DEXAMETHASONE SODIUM PHOSPHATE 10 MG/ML IJ SOLN
INTRAMUSCULAR | Status: DC | PRN
  Administered 2022-11-12: 4 mg via INTRAVENOUS

## 2022-11-12 MED ORDER — SCOPOLAMINE 1 MG/3DAYS TD PT72: 1.0000 | MEDICATED_PATCH | Freq: Once | TRANSDERMAL | Status: DC

## 2022-11-12 MED ORDER — COCONUT OIL OIL: 1.0000 | TOPICAL_OIL | Status: DC | PRN

## 2022-11-12 MED ORDER — DOCUSATE SODIUM 100 MG PO CAPS
100.0000 mg | ORAL_CAPSULE | Freq: Two times a day (BID) | ORAL | Status: DC
  Administered 2022-11-13 – 2022-11-15 (×4): 100 mg via ORAL
  Filled 2022-11-12 (×5): qty 1

## 2022-11-12 MED ORDER — WITCH HAZEL-GLYCERIN EX PADS: 1.0000 | MEDICATED_PAD | CUTANEOUS | Status: DC | PRN

## 2022-11-12 MED ORDER — FENTANYL CITRATE (PF) 100 MCG/2ML IJ SOLN
INTRAMUSCULAR | Status: DC | PRN
  Administered 2022-11-12: 15 ug via INTRATHECAL

## 2022-11-12 MED ORDER — NALOXONE HCL 4 MG/10ML IJ SOLN: 1.0000 ug/kg/h | INTRAVENOUS | Status: DC | PRN

## 2022-11-12 MED ORDER — DIBUCAINE (PERIANAL) 1 % EX OINT: 1.0000 | TOPICAL_OINTMENT | CUTANEOUS | Status: DC | PRN

## 2022-11-12 MED ORDER — LACTATED RINGERS IV SOLN: INTRAVENOUS | Status: DC | PRN

## 2022-11-12 MED ORDER — DIPHENHYDRAMINE HCL 25 MG PO CAPS: 25.0000 mg | ORAL_CAPSULE | Freq: Four times a day (QID) | ORAL | Status: DC | PRN

## 2022-11-12 MED ORDER — SODIUM CHLORIDE 0.9 % IR SOLN
Status: DC | PRN
  Administered 2022-11-12: 1

## 2022-11-12 MED ORDER — SODIUM CHLORIDE 0.9% FLUSH: 3.0000 mL | INTRAVENOUS | Status: DC | PRN

## 2022-11-12 MED ORDER — ACETAMINOPHEN 10 MG/ML IV SOLN
INTRAVENOUS | Status: DC | PRN
  Administered 2022-11-12: 1000 mg via INTRAVENOUS

## 2022-11-12 MED ORDER — FAMOTIDINE 20 MG PO TABS
20.0000 mg | ORAL_TABLET | Freq: Every day | ORAL | Status: DC
  Filled 2022-11-12: qty 1

## 2022-11-12 MED ORDER — HYDROMORPHONE HCL 1 MG/ML IJ SOLN: 0.2500 mg | INTRAMUSCULAR | Status: DC | PRN

## 2022-11-12 MED ORDER — STERILE WATER FOR IRRIGATION IR SOLN
Status: DC | PRN
  Administered 2022-11-12: 1000 mL

## 2022-11-12 MED ORDER — DIPHENHYDRAMINE HCL 50 MG/ML IJ SOLN: 12.5000 mg | INTRAMUSCULAR | Status: DC | PRN

## 2022-11-12 MED ORDER — ZOLPIDEM TARTRATE 5 MG PO TABS: 5.0000 mg | ORAL_TABLET | Freq: Every evening | ORAL | Status: DC | PRN

## 2022-11-12 MED ORDER — MENTHOL 3 MG MT LOZG: 1.0000 | LOZENGE | OROMUCOSAL | Status: DC | PRN

## 2022-11-12 MED ORDER — OXYCODONE HCL 5 MG PO TABS: 5.0000 mg | ORAL_TABLET | Freq: Once | ORAL | Status: DC | PRN

## 2022-11-12 MED ORDER — FENTANYL CITRATE (PF) 100 MCG/2ML IJ SOLN
INTRAMUSCULAR | Status: AC
  Filled 2022-11-12: qty 2

## 2022-11-12 MED ORDER — KETOROLAC TROMETHAMINE 30 MG/ML IJ SOLN
30.0000 mg | Freq: Once | INTRAMUSCULAR | Status: AC | PRN
  Administered 2022-11-12: 30 mg via INTRAVENOUS

## 2022-11-12 MED ORDER — AMISULPRIDE (ANTIEMETIC) 5 MG/2ML IV SOLN: 10.0000 mg | Freq: Once | INTRAVENOUS | Status: DC | PRN

## 2022-11-12 MED ORDER — DIPHENHYDRAMINE HCL 25 MG PO CAPS: 25.0000 mg | ORAL_CAPSULE | ORAL | Status: DC | PRN

## 2022-11-12 MED ORDER — OXYTOCIN-SODIUM CHLORIDE 30-0.9 UT/500ML-% IV SOLN
INTRAVENOUS | Status: DC | PRN
  Administered 2022-11-12: 300 mL via INTRAVENOUS

## 2022-11-12 MED ORDER — ONDANSETRON HCL 4 MG/2ML IJ SOLN
INTRAMUSCULAR | Status: AC
  Filled 2022-11-12: qty 2

## 2022-11-12 MED ORDER — MEPERIDINE HCL 25 MG/ML IJ SOLN: 6.2500 mg | INTRAMUSCULAR | Status: DC | PRN

## 2022-11-12 MED ORDER — ACETAMINOPHEN 500 MG PO TABS
1000.0000 mg | ORAL_TABLET | Freq: Four times a day (QID) | ORAL | Status: AC
  Administered 2022-11-13 (×4): 1000 mg via ORAL
  Filled 2022-11-12 (×3): qty 2

## 2022-11-12 MED ORDER — OXYCODONE HCL 5 MG PO TABS
5.0000 mg | ORAL_TABLET | ORAL | Status: DC | PRN
  Administered 2022-11-14: 10 mg via ORAL
  Administered 2022-11-14: 5 mg via ORAL
  Administered 2022-11-15: 10 mg via ORAL
  Filled 2022-11-12: qty 1
  Filled 2022-11-12 (×2): qty 2

## 2022-11-12 MED ORDER — DEXAMETHASONE SODIUM PHOSPHATE 4 MG/ML IJ SOLN
INTRAMUSCULAR | Status: AC
  Filled 2022-11-12: qty 1

## 2022-11-12 MED ORDER — PHENYLEPHRINE HCL-NACL 20-0.9 MG/250ML-% IV SOLN
INTRAVENOUS | Status: DC | PRN
  Administered 2022-11-12: 60 ug/min via INTRAVENOUS

## 2022-11-12 MED ORDER — IBUPROFEN 600 MG PO TABS
600.0000 mg | ORAL_TABLET | Freq: Four times a day (QID) | ORAL | Status: DC
  Administered 2022-11-13 – 2022-11-15 (×8): 600 mg via ORAL
  Filled 2022-11-12 (×9): qty 1

## 2022-11-12 MED ORDER — ACETAMINOPHEN 325 MG PO TABS
650.0000 mg | ORAL_TABLET | ORAL | Status: DC | PRN
  Administered 2022-11-14 (×3): 650 mg via ORAL
  Filled 2022-11-12 (×3): qty 2

## 2022-11-12 MED ORDER — SOD CITRATE-CITRIC ACID 500-334 MG/5ML PO SOLN
30.0000 mL | ORAL | Status: AC
  Administered 2022-11-12: 30 mL via ORAL
  Filled 2022-11-12: qty 30

## 2022-11-12 MED ORDER — SIMETHICONE 80 MG PO CHEW
80.0000 mg | CHEWABLE_TABLET | ORAL | Status: DC | PRN
  Filled 2022-11-12: qty 1

## 2022-11-12 MED ORDER — ONDANSETRON HCL 4 MG/2ML IJ SOLN: 4.0000 mg | Freq: Once | INTRAMUSCULAR | Status: DC | PRN

## 2022-11-12 MED ORDER — MORPHINE SULFATE (PF) 0.5 MG/ML IJ SOLN
INTRAMUSCULAR | Status: AC
  Filled 2022-11-12: qty 10

## 2022-11-12 MED ORDER — LACTATED RINGERS IV SOLN: INTRAVENOUS | Status: DC

## 2022-11-12 MED ORDER — CEFAZOLIN SODIUM-DEXTROSE 2-4 GM/100ML-% IV SOLN
2.0000 g | INTRAVENOUS | Status: AC
  Administered 2022-11-12: 2 g via INTRAVENOUS
  Filled 2022-11-12: qty 100

## 2022-11-12 MED ORDER — ACETAMINOPHEN 10 MG/ML IV SOLN
INTRAVENOUS | Status: AC
  Filled 2022-11-12: qty 100

## 2022-11-12 MED ORDER — NALOXONE HCL 0.4 MG/ML IJ SOLN: 0.4000 mg | INTRAMUSCULAR | Status: DC | PRN

## 2022-11-12 MED ORDER — PRENATAL MULTIVITAMIN CH
1.0000 | ORAL_TABLET | Freq: Every day | ORAL | Status: DC
  Administered 2022-11-13 – 2022-11-14 (×2): 1 via ORAL
  Filled 2022-11-12 (×2): qty 1

## 2022-11-12 MED ORDER — OXYTOCIN-SODIUM CHLORIDE 30-0.9 UT/500ML-% IV SOLN: 2.5000 [IU]/h | INTRAVENOUS | Status: AC

## 2022-11-12 MED ORDER — PHENYLEPHRINE HCL-NACL 20-0.9 MG/250ML-% IV SOLN
INTRAVENOUS | Status: AC
  Filled 2022-11-12: qty 250

## 2022-11-12 SURGICAL SUPPLY — 29 items
BENZOIN TINCTURE PRP APPL 2/3 (GAUZE/BANDAGES/DRESSINGS) IMPLANT
CHLORAPREP W/TINT 26 (MISCELLANEOUS) ×2 IMPLANT
CLAMP UMBILICAL CORD (MISCELLANEOUS) ×1 IMPLANT
CLOTH BEACON ORANGE TIMEOUT ST (SAFETY) ×1 IMPLANT
DRSG OPSITE POSTOP 4X10 (GAUZE/BANDAGES/DRESSINGS) ×1 IMPLANT
ELECT REM PT RETURN 9FT ADLT (ELECTROSURGICAL) ×1
ELECTRODE REM PT RTRN 9FT ADLT (ELECTROSURGICAL) ×1 IMPLANT
EXTRACTOR VACUUM M CUP 4 TUBE (SUCTIONS) IMPLANT
GLOVE BIOGEL PI IND STRL 7.0 (GLOVE) ×2 IMPLANT
GLOVE BIOGEL PI IND STRL 7.5 (GLOVE) ×1 IMPLANT
GLOVE ECLIPSE 7.0 STRL STRAW (GLOVE) ×1 IMPLANT
GOWN STRL REUS W/TWL LRG LVL3 (GOWN DISPOSABLE) ×2 IMPLANT
KIT ABG SYR 3ML LUER SLIP (SYRINGE) IMPLANT
NDL HYPO 25X5/8 SAFETYGLIDE (NEEDLE) IMPLANT
NEEDLE HYPO 25X5/8 SAFETYGLIDE (NEEDLE) IMPLANT
NS IRRIG 1000ML POUR BTL (IV SOLUTION) ×1 IMPLANT
PACK C SECTION WH (CUSTOM PROCEDURE TRAY) ×1 IMPLANT
PAD OB MATERNITY 4.3X12.25 (PERSONAL CARE ITEMS) ×1 IMPLANT
STRIP CLOSURE SKIN 1/2X4 (GAUZE/BANDAGES/DRESSINGS) IMPLANT
SUT PDS AB 0 CTX 60 (SUTURE) ×2 IMPLANT
SUT PLAIN 0 NONE (SUTURE) IMPLANT
SUT PLAIN 2 0 XLH (SUTURE) IMPLANT
SUT VIC AB 0 CT1 36 (SUTURE) IMPLANT
SUT VIC AB 0 CTX 36 (SUTURE) ×3
SUT VIC AB 0 CTX36XBRD ANBCTRL (SUTURE) ×3 IMPLANT
SUT VIC AB 4-0 KS 27 (SUTURE) ×1 IMPLANT
TOWEL OR 17X24 6PK STRL BLUE (TOWEL DISPOSABLE) ×1 IMPLANT
TRAY FOLEY W/BAG SLVR 14FR LF (SET/KITS/TRAYS/PACK) ×1 IMPLANT
WATER STERILE IRR 1000ML POUR (IV SOLUTION) ×1 IMPLANT

## 2022-11-12 NOTE — Anesthesia Postprocedure Evaluation (Signed)
Anesthesia Post Note  Patient: Destiny Keith  Procedure(s) Performed: CESAREAN SECTION     Patient location during evaluation: PACU Anesthesia Type: Spinal Level of consciousness: awake and alert and oriented Pain management: pain level controlled Vital Signs Assessment: post-procedure vital signs reviewed and stable Respiratory status: spontaneous breathing, nonlabored ventilation and respiratory function stable Cardiovascular status: blood pressure returned to baseline and stable Postop Assessment: no headache, no backache, spinal receding, patient able to bend at knees and no apparent nausea or vomiting Anesthetic complications: no  No notable events documented.  Last Vitals:  Vitals:   11/12/22 2200 11/12/22 2215  BP: 112/70 113/73  Pulse: 75 73  Resp: 14 17  Temp: 36.6 C   SpO2: 96% 96%    Last Pain:  Vitals:   11/12/22 2215  PainSc: [place]

## 2022-11-12 NOTE — Anesthesia Preprocedure Evaluation (Addendum)
Anesthesia Evaluation  Patient identified by MRN, date of birth, ID band Patient awake    Reviewed: Allergy & Precautions, H&P , NPO status , Patient's Chart, lab work & pertinent test results  Airway Mallampati: II  TM Distance: >3 FB Neck ROM: Full    Dental no notable dental hx.    Pulmonary neg pulmonary ROS   Pulmonary exam normal breath sounds clear to auscultation       Cardiovascular negative cardio ROS Normal cardiovascular exam Rhythm:Regular Rate:Normal     Neuro/Psych  Headaches  negative psych ROS   GI/Hepatic negative GI ROS, Neg liver ROS,,,  Endo/Other  negative endocrine ROS    Renal/GU negative Renal ROS  negative genitourinary   Musculoskeletal negative musculoskeletal ROS (+)    Abdominal   Peds negative pediatric ROS (+)  Hematology negative hematology ROS (+)   Anesthesia Other Findings   Reproductive/Obstetrics (+) Pregnancy decreased movement and NST was not reactive, BPP 2/10.                              Anesthesia Physical Anesthesia Plan  ASA: 2  Anesthesia Plan: Spinal   Post-op Pain Management: Regional block, Toradol IV (intra-op)* and Ofirmev IV (intra-op)*   Induction:   PONV Risk Score and Plan: 3 and Ondansetron, Dexamethasone and Treatment may vary due to age or medical condition  Airway Management Planned: Natural Airway and Nasal Cannula  Additional Equipment: None  Intra-op Plan:   Post-operative Plan:   Informed Consent: I have reviewed the patients History and Physical, chart, labs and discussed the procedure including the risks, benefits and alternatives for the proposed anesthesia with the patient or authorized representative who has indicated his/her understanding and acceptance.       Plan Discussed with: CRNA  Anesthesia Plan Comments: (Not appropriately NPO until 21:00, however per surgeon case is urgent)        Anesthesia Quick Evaluation

## 2022-11-12 NOTE — Op Note (Signed)
Operative Note  PROCEDURE DATE: 11/12/22   PREOPERATIVE DIAGNOSIS: BPP 2/10, [redacted]w[redacted]d  POSTOPERATIVE DIAGNOSIS: The same   PROCEDURE:    Primary Low Transverse Cesarean Section   SURGEON:  Dr. AAlpha GulaASSISTANT: Dr. CCaron Presume An experienced assistant was required given the standard of surgical care given the complexity of the case.  This assistant was needed for exposure, dissection, suctioning, retraction, instrument exchange, assisting with delivery with administration of fundal pressure, and for overall help during the procedure.    INDICATIONS: This is a 37y.o. yo [id number] 350w4dequiring cesarean section secondary to abnormal antenatal testing.   Patient was seen in the office and reported decreased fetal movement.  NST was done and was nonreactive.  BPP was then done with only points for amniotic fluid.  Thus, score was 2/10 for BPP.  We discussed trial of labor vs primary C section for suspected compromised placental function.  Discussed the risks and benefits of each, including additional risks of C section, but also progression of nonreassuring status with trial of labor.  Decision made to proceed with LTCS. The risks of cesarean section discussed with the patient included but were not limited to: bleeding which may require transfusion or reoperation; infection which may require antibiotics; injury to bowel, bladder, ureters or other surrounding organs; injury to the fetus; need for additional procedures including hysterectomy in the event of a life-threatening hemorrhage; placental abnormalities wth subsequent pregnancies, incisional problems, thromboembolic phenomenon and other postoperative/anesthesia complications. The patient agreed with the proposed plan, giving informed consent for the procedure.     FINDINGS:  Viable female infant in vertex presentation, APGARs pend,  Weight pending, Amniotic fluid clear,  Intact placenta with fibrin deposits, calcifications.  Three  vessel cord.  Grossly normal uterus  .   ANESTHESIA:    Epidural ESTIMATED BLOOD LOSS: 444 cc SPECIMENS: Placenta for pathology COMPLICATIONS: None immediate    PROCEDURE IN DETAIL:  The patient received intravenous antibiotics (2g Ancef) and had sequential compression devices applied to her lower extremities while in the preoperative area.  She was then taken to the operating room where epidural anesthesia was dosed up to surgical level and was found to be adequate. She was then placed in a dorsal supine position with a leftward tilt, and prepped and draped in a sterile manner.  A foley catheter was placed into her bladder and attached to constant gravity.  After an adequate timeout was performed, a Pfannenstiel skin incision was made with scalpel and carried through to the underlying layer of fascia. The fascia was incised in the midline and this incision was extended bilaterally using the Mayo scissors. Kocher clamps were applied to the superior aspect of the fascial incision and the underlying rectus muscles were dissected off bluntly. A similar process was carried out on the inferior aspect of the facial incision. The rectus muscles were separated in the midline bluntly and the peritoneum was entered bluntly.  A bladder flap was created sharply and developed bluntly. A transverse hysterotomy was made with a scalpel and extended bilaterally bluntly. The bladder blade was then removed. The infant was successfully delivered, and cord was clamped and cut and infant was handed over to awaiting neonatology team. Uterine massage was then administered and the placenta delivered intact with three-vessel cord. The uterus was cleared of clot and debris.  The hysterotomy was closed with 0 monocryl.  A second imbricating suture of 0-monocryl was used to reinforce the incision and aid in hemostasis.The fascia  was closed with 0-PDS in a running fashion with good restoration of anatomy.  The subcutaneus tissue was  irrigated and was reapproximated using running plain gut.  The skin was closed with 4-0 Vicryl in a subcuticular fashion.  All surgical site and was hemostatic at end of procedure without any further bleeding on exam.    Pt tolerated the procedure well. All sponge/lap/needle counts were correct  X 2. Pt taken to recovery room in stable condition.     Alpha Gula MD

## 2022-11-12 NOTE — Transfer of Care (Signed)
Immediate Anesthesia Transfer of Care Note  Patient: Destiny Keith  Procedure(s) Performed: CESAREAN SECTION  Patient Location: PACU  Anesthesia Type:Spinal  Level of Consciousness: awake  Airway & Oxygen Therapy: Patient Spontanous Breathing  Post-op Assessment: Report given to RN and Post -op Vital signs reviewed and stable  Post vital signs: Reviewed and stable  Last Vitals:  Vitals Value Taken Time  BP 116/51 11/12/22 2130  Temp    Pulse 76 11/12/22 2133  Resp 20 11/12/22 2133  SpO2 96 % 11/12/22 2133  Vitals shown include unvalidated device data.  Last Pain:  Vitals:   11/12/22 1831  PainSc: 8          Complications: No notable events documented.

## 2022-11-12 NOTE — H&P (Signed)
OB History and Physical   Destiny Keith is a 37 y.o. female [id number] presenting from office at 107w4dfor BPP of 2/10.  Pregnancy has been complicated by hyperemesis requiring MAU visits and admission. Her prior pregnancy was complicated by IUGR and required vacuum assistance.   She was seen in the office today with complaints of decreased movement and NST was not reactive.  BPP was done and was 2/10.   In MAU, she reports ongoing intermittent contractions and back pain. She denies LOF or bleeding.  Rh positive, GBS negative 10/24/22, panorama low risk.  Most recent [place], baby EFW 30%ile (7'0oz)   OB History     Gravida  4   Para  1   Term  1   Preterm      AB  2   Living  1      SAB  1   IAB  1   Ectopic      Multiple  0   Live Births  1          Past Medical History:  Diagnosis Date   Fibroid    Genital warts    Headache    Ovarian cyst    UTI (urinary tract infection)    Past Surgical History:  Procedure Laterality Date   NO PAST SURGERIES     Family History: family history includes Diabetes in her maternal grandmother; Healthy in her father and mother; Hypertension in her maternal grandmother; Thyroid disease in her maternal grandmother. Social History:  reports that she has never smoked. She has never used smokeless tobacco. She reports that she does not currently use alcohol. She reports that she does not use drugs.     Maternal Diabetes: No Genetic Screening: Normal Maternal Ultrasounds/Referrals: Normal Fetal Ultrasounds or other Referrals:  Referred to Materal Fetal Medicine  Maternal Substance Abuse:  No Significant Maternal Medications:  None Significant Maternal Lab Results:  GBS negative 2/7 Other Comments:  None  Review of Systems - Patient denies fever, chills, SOB, CP, N/V/D.  History   Blood pressure (!) 136/90, pulse 97, temperature 98.2 F (36.8 C), resp. rate 18, last menstrual period 02/08/2022, SpO2 98 %, unknown if  currently breastfeeding. Exam Physical Exam   Gen: alert, well appearing, no distress Chest: nonlabored breathing CV: no peripheral edema Abdomen: soft, gravid  Ext: no evidence of DVT  Prenatal labs: ABO, Rh: --/--/O POS (08/10 1543) Antibody: NEG (08/10 1543) Rubella:   RPR:    HBsAg:    HIV:    GBS:     Assessment/Plan: Discussed with patient concerning BPP score of 2/10.  Prompt delivery is indicated. No decels on tracing in MAU, only 10x10 accels. Discussed concern for placental function and baby may not tolerate labor.  She had difficult second stage with prior baby.  I offered Primary C section for nonreassuring fetal status by BPP 2/10 and she accepts. Patient was counseled on the risks of Cesarean delivery, which include but are not limited to bleeding, infection, damage to nearby organs including bowel/bladder/ureter, need for additional procedure or blood transfusion, and implications for future pregnancy.  Patient agreeable to procedure, all questions answered.  Mild range elevated BP, CMP and P/C ratio collected. No s/s PIH. Discussed with OR, anesthesia.  Patient last ate food at 1 pm, will proceed with case as "urgent".   TCarlyon Shadow2/26/2024, 6:59 PM

## 2022-11-12 NOTE — Anesthesia Procedure Notes (Signed)
Spinal  Patient location during procedure: OR Start time: 11/12/2022 8:04 PM End time: 11/12/2022 8:08 PM Reason for block: surgical anesthesia Staffing Performed: anesthesiologist  Anesthesiologist: Pervis Hocking, DO Performed by: Pervis Hocking, DO Authorized by: Pervis Hocking, DO   Preanesthetic Checklist Completed: patient identified, IV checked, risks and benefits discussed, surgical consent, monitors and equipment checked, pre-op evaluation and timeout performed Spinal Block Patient position: sitting Prep: DuraPrep and site prepped and draped Patient monitoring: cardiac monitor, continuous pulse ox and blood pressure Approach: midline Location: L3-4 Injection technique: single-shot Needle Needle type: Pencan  Needle gauge: 24 G Needle length: 9 cm Assessment Sensory level: T6 Events: CSF return Additional Notes Functioning IV was confirmed and monitors were applied. Sterile prep and drape, including hand hygiene and sterile gloves were used. The patient was positioned and the spine was prepped. The skin was anesthetized with lidocaine.  Free flow of clear CSF was obtained prior to injecting local anesthetic into the CSF.  The spinal needle aspirated freely following injection.  The needle was carefully withdrawn.  The patient tolerated the procedure well.

## 2022-11-12 NOTE — MAU Note (Signed)
Destiny Keith is a 37 y.o. at 2w4dhere in MAU reporting: sent in for admission.  BPP 2/10 today, ? Induction/or c/s.  Reports has been "lazy late lately .  Also abd has been tight the last couple days, ? Contractions.  Onset of complaint: 2 days  There were no vitals filed for this visit.   FHT:[id number] Lab orders placed from triage:     Feeling some movement now

## 2022-11-13 ENCOUNTER — Encounter (HOSPITAL_COMMUNITY): Payer: Self-pay | Admitting: Obstetrics and Gynecology

## 2022-11-13 LAB — CBC
HCT: 33.9 % — ABNORMAL LOW (ref 36.0–46.0)
Hemoglobin: 11.6 g/dL — ABNORMAL LOW (ref 12.0–15.0)
MCH: 30.4 pg (ref 26.0–34.0)
MCHC: 34.2 g/dL (ref 30.0–36.0)
MCV: 89 fL (ref 80.0–100.0)
Platelets: 177 10*3/uL (ref 150–400)
RBC: 3.81 MIL/uL — ABNORMAL LOW (ref 3.87–5.11)
RDW: 14.2 % (ref 11.5–15.5)
WBC: 12.7 10*3/uL — ABNORMAL HIGH (ref 4.0–10.5)
nRBC: 0 % (ref 0.0–0.2)

## 2022-11-13 LAB — RPR: RPR Ser Ql: NONREACTIVE

## 2022-11-13 MED ORDER — DONOR BREAST MILK (FOR LABEL PRINTING ONLY): ORAL | Status: DC

## 2022-11-13 NOTE — Progress Notes (Signed)
Subjective: Postpartum Day 1: Cesarean Delivery Patient reports incisional pain, tolerating PO, and no problems voiding.    Objective: Vital signs in last 24 hours: Temp:  [97.8 F (36.6 C)-98.6 F (37 C)] 98.3 F (36.8 C) (02/27 0800) Pulse Rate:  [64-97] 64 (02/27 0800) Resp:  [14-18] 16 (02/27 0800) BP: (106-141)/(51-94) 106/73 (02/27 0800) SpO2:  [96 %-100 %] 97 % (02/27 0800) Weight:  [83.9 kg] 83.9 kg (02/26 1923)  Physical Exam:  General: alert, cooperative, appears stated age, and mild distress Lochia: appropriate Uterine Fundus: firm Incision: healing well DVT Evaluation: No evidence of DVT seen on physical exam.  Recent Labs    11/12/22 1917 11/13/22 0444  HGB 12.6 11.6*  HCT 37.8 33.9*    Assessment/Plan: Status post Cesarean section. Doing well postoperatively.  Continue current care Desires circ before discharge.  Luz Lex, MD 11/13/2022, 11:30 AM

## 2022-11-13 NOTE — Lactation Note (Signed)
This note was copied from a baby's chart. Lactation Consultation Note  Patient Name: Destiny Keith [id number] Date: 11/13/2022  Reason for consult: Follow-up assessment;Mother's request;Difficult latch;Breastfeeding assistance;Term  Age:35 hours P2, 1% weight loss, difficult latch  Upon arrival to room, baby was skin to skin with mother. She reports she had attempted to latch with her nipple shield brought from home and baby did not latch. She reports he became sleepy and disinterested. Mother then pumped with the hand pump and did not collect colostrum. Mother was disappointed that she did not express milk. She is very motivated "to be successful with breastfeeding this time".  Assisted with latch with and without NS. Nipples are flat at rest. When mother pre-pumped, her left nipple protruded. She reports "the right nipple does not do as well but she can get more colostrum from that breast. With assistance, baby latched and suckled in burst on the left breast, did not sustain. Reviewed application of NS once nipple was protruding. Nipple shield stayed on with nipple extension and remained extended when baby briefly latched and following detachment. Encouraged to use the same method when using NS and call for help if assistance is needed.  Mother discouraged after pumping because she did not collect colostrum as she had with her first pumping. Reassured and advised to pump every 3 hours for 15 minutes with the DEBP in the initiation phase to induce milk production.   RN has ordered donor breast milk at mother's request and she prefers to feed DBM by bottle. Discussed feeding baby any of her collected milk followed by [facility]. Volume guidelines given.   Feeding plan: Pre-pump breast, latch with or without nipple shield, as baby tolerates. May place EBM/DBM in NS to entice latch.  Supplement with EBM/ DBM Pump both breast with DEBP every 3 hours for 15 min.     Maternal Data Has patient been  taught Hand Expression?: Yes Does the patient have breastfeeding experience prior to this delivery?: Yes  Feeding Mother's Current Feeding Choice: Breast Milk and Donor Milk  LATCH Score Latch: Repeated attempts needed to sustain latch, nipple held in mouth throughout feeding, stimulation needed to elicit sucking reflex.  Audible Swallowing: None  Type of Nipple: Flat  Comfort (Breast/Nipple): Soft / non-tender  Hold (Positioning): Assistance needed to correctly position infant at breast and maintain latch.  LATCH Score: 5   Lactation Tools Discussed/Used Tools: Flanges;Pump;Nipple Shields Nipple shield size:  (17 mm/ from home) Flange Size: 21 Breast pump type: Double-Electric Breast Pump Pump Education: Setup, frequency, and cleaning;Milk Storage Pumping frequency: every 3 hours for 15 minutes to stimulate milk production/ provide EBM for supplementation  Interventions Interventions: Breast feeding basics reviewed;Assisted with latch;Skin to skin;Hand express;Breast compression;Adjust position;Support pillows;Position options;Hand pump;DEBP;Education  Discharge Pump: DEBP;Personal  Consult Status Consult Status: Follow-up Date: 11/14/22    Gwenevere Abbot 11/13/2022, 9:53 AM

## 2022-11-13 NOTE — Lactation Note (Signed)
This note was copied from a baby's chart. Lactation Consultation Note  Patient Name: Destiny Keith [id number] Date: 11/13/2022 Reason for consult: Initial assessment;Term Age:35 hours Current feeding preference is : breastfeeding and supplementing with donor breast milk.  Birth Parent declined interpreter Birth Parent and Support person both speak English. Birth Parent informed [place] she would like to breastfeed infant and use donor breast milk. Birth Parent has her own personal [place] in [place], she brought a 17 mm NS from home,  she has been using it prior to latching infant at the breast. Birth Parent pre-pumped breast with hand pump using 21 mm breast flange, afterwards applied 17 mm NS, infant sustained latch and BF for 10 minutes,.LC observed colostrum in NS and Birth Parent stated she was cramping while infant was latched. She is excited that infant is breastfeeding she exclusively pumped with her 1st child because of latch difficulties. Birth Parent informed LC she already knows how to hand express and that she was hand expressing and pumping in her pregnancy. After latching infant at the breast , she had expressed 5 mls of colostrum that was spoon fed to infant. Birth Parent will continue to ask RN/LC for latch assistance if needed.  Due to using 17 mm NS, Birth Parent was set up with DEBP, Birth Parent was pumping as LC left the room, Birth Parent will continue to use DEBP every 3 hours for 15 minutes and give infant back any EBM that she pump prior to supplement infant with donor breast milk, this is her feeding choice. Birth Parent understands that colostrum is safe for 4 hours at room temperature whereas donor breast milk must be used within 1 hour. LC discussed importance of maternal diet, rest and hydration. Birth Parent was  made aware of O/P services, breastfeeding support groups, community resources, and our phone # for post-discharge questions.   Maternal Data Has patient been taught  Hand Expression?: Yes Does the patient have breastfeeding experience prior to this delivery?: Yes How long did the patient breastfeed?: Per Birth Parent, she had latch difficulties with her 1st child so she exclusively pumped for 3 months  and  she formula fed infant.  Feeding Mother's Current Feeding Choice: Breast Milk and Donor Milk  LATCH Score Latch: Grasps breast easily, tongue down, lips flanged, rhythmical sucking. (Birth Parent used 17 mm NS, she brought from home.)  Audible Swallowing: A few with stimulation  Type of Nipple: Inverted ( Birth Parent pre-pump breast with hand pump prior to latching infant).   Comfort (Breast/Nipple): Soft / non-tender  Hold (Positioning): Assistance needed to correctly position infant at breast and maintain latch.  LATCH Score: 6   Lactation Tools Discussed/Used Tools: Pump;Flanges;Nipple Shields Nipple shield size: Other (comment) (17 mm Birth Parent brough from home) Breast pump type: Double-Electric Breast Pump Pump Education: Setup, frequency, and cleaning;Milk Storage Reason for Pumping: Birth Parent has been using 17 mm NS, help supply Pumping frequency: Birth Parent will continue to pump every 3 hours for 15 minutes on inital setting.  Interventions Interventions: Breast feeding basics reviewed;Adjust position;Support pillows;Assisted with latch;Education;Breast massage;Skin to skin;Position options;Breast compression;LC Services brochure;Hand pump;DEBP  Discharge Pump: DEBP;Personal (Spectra 2 DEBP at home.)  Consult Status Consult Status: Follow-up Date: 11/13/22 Follow-up type: In-patient    Destiny Keith 11/13/2022, 12:14 AM

## 2022-11-14 LAB — SURGICAL PATHOLOGY

## 2022-11-14 NOTE — Lactation Note (Signed)
This note was copied from a baby's chart. Lactation Consultation Note  Patient Name: Destiny Keith [id number] Date: 11/14/2022 Age:35 hours  Reason for consult: Follow-up assessment;Term  P2, 39.4 GA, 5% weight loss  Mother has a lactation support service, [employer], that she is using for breastfeeding assistance while in the hospital and after discharge. Today, Ron Parker, Lactation Counselor is here to assist mother with breastfeeding at mother's request. Baby is sleepy after circumcision and not interested in latching. Mother pumped 5 ml of colostrum and she is continuing to give supplemental feedings to baby with donor breast milk . Infant was fed 10 ml of DBM and EBM by syringe.  Mother reports that she did not pump during the night. Instructed on the benefits of pumping every 2-3 hours for 15 minutes to stimulate and establish her milk supply. Lactation Counselor is leaving. Instructed mother to call LC/RN for breastfeeding assistance.    Maternal Data Has patient been taught Hand Expression?: Yes  Feeding Mother's Current Feeding Choice: Breast Milk and Donor Milk Nipple Type: Extra Slow Flow   Lactation Tools Discussed/Used Tools: Pump;Flanges;Nipple [place] [employer] (mother brought from home, 17 mm) Reason for Pumping: stimulate milk production Pumped volume: 5 mL  Interventions Interventions: Education  Discharge Pump: DEBP  Consult Status Consult Status: Follow-up Date: 11/15/22 Follow-up type: In-patient    Stana Bunting M 11/14/2022, 11:28 AM

## 2022-11-14 NOTE — Progress Notes (Signed)
Subjective: Postpartum Day 2: Cesarean Delivery Patient reports incisional pain, tolerating PO, and no problems voiding.    Objective: Vital signs in last 24 hours: Temp:  [97.9 F (36.6 C)-99 F (37.2 C)] 99 F (37.2 C) (02/28 0539) Pulse Rate:  [70-85] 85 (02/28 0539) Resp:  [16-18] 18 (02/28 0539) BP: (100-125)/(60-81) 113/61 (02/28 0539) SpO2:  [96 %-100 %] 100 % (02/28 0539)  Physical Exam:  General: alert Lochia: appropriate Uterine Fundus: firm Incision: healing well DVT Evaluation: No evidence of DVT seen on physical exam.  Recent Labs    11/12/22 1917 11/13/22 0444  HGB 12.6 11.6*  HCT 37.8 33.9*    Assessment/Plan: Status post Cesarean section. Doing well postoperatively.  Continue current care. Anticipate d/c tomorrow.  D/W patient female infant circumcision, risks/benefits reviewed. All questions answered.   Tyson Dense, MD 11/14/2022, 8:21 AM

## 2022-11-15 MED ORDER — MAGNESIUM HYDROXIDE 400 MG/5ML PO SUSP
30.0000 mL | Freq: Once | ORAL | Status: AC
  Administered 2022-11-15: 30 mL via ORAL
  Filled 2022-11-15: qty 30

## 2022-11-15 MED ORDER — IBUPROFEN 600 MG PO TABS: 600.0000 mg | ORAL_TABLET | Freq: Four times a day (QID) | ORAL | 0 refills | Status: DC | PRN

## 2022-11-15 MED ORDER — OXYCODONE HCL 5 MG PO TABS: 5.0000 mg | ORAL_TABLET | Freq: Four times a day (QID) | ORAL | 0 refills | Status: DC | PRN

## 2022-11-15 NOTE — Progress Notes (Signed)
Subjective: Postpartum Day 3: Cesarean Delivery Patient reports incisional pain, tolerating PO, + flatus, and no problems voiding.   Ambulating well Hx of constipation  Objective: Vital signs in last 24 hours: Temp:  [98.2 F (36.8 C)-98.3 F (36.8 C)] 98.3 F (36.8 C) (02/29 0530) Pulse Rate:  [71-94] 93 (02/29 0530) Resp:  [16-17] 16 (02/29 0530) BP: (104-111)/(57-68) 106/68 (02/29 0530) SpO2:  [96 %-100 %] 96 % (02/29 0530)  Physical Exam:  General: alert, cooperative, and no distress Lochia: appropriate Uterine Fundus: firm Incision: healing well DVT Evaluation: No evidence of DVT seen on physical exam. Abdomen soft, good BS  Recent Labs    11/12/22 1917 11/13/22 0444  HGB 12.6 11.6*  HCT 37.8 33.9*    Assessment/Plan: Status post Cesarean section. Doing well postoperatively.  Discharge home with standard precautions and return to clinic in 4-6 weeks.  Shon Millet II, MD 11/15/2022, 7:24 AM

## 2022-11-15 NOTE — Discharge Summary (Signed)
Postpartum Discharge Summary  Date of Service updated 11/15/22     Patient Name: Destiny Keith DOB: June 22, 1986 MRN: [id number]  Date of admission: 11/12/2022 Delivery date:11/12/2022  Delivering provider: Eyvonne Mechanic A  Date of discharge: 11/15/2022  Admitting diagnosis: Pregnancy [Z34.90] Intrauterine pregnancy: [redacted]w[redacted]d    Secondary diagnosis:  Principal Problem:   Pregnancy  Additional problems:     Discharge diagnosis: Term Pregnancy Delivered                                              Post partum procedures: Augmentation: N/A Complications: None  Hospital course: Sceduled C/S   37y.o. yo [id number] admitted to the hospital 11/12/2022 for scheduled cesarean section with the following indication: non reassuring BPP .Delivery details are as follows:  Membrane Rupture Time/Date: 8:31 PM ,11/12/2022   Delivery Method:C-Section, Low Transverse  Details of operation can be found in separate operative note.  Patient had a postpartum course complicated by.  She is ambulating, tolerating a regular diet, passing flatus, and urinating well. Patient is discharged home in stable condition on  11/15/22        Newborn Data: Birth date:11/12/2022  Birth time:8:32 PM  Gender:Female  Living status:Living  Apgars:8 ,9  Weight:3280 g     Magnesium Sulfate received: No BMZ received: No Rhophylac:No MMR:No T-DaP:Given prenatally Flu: No Transfusion:No  Physical exam  Vitals:   11/14/22 0539 11/14/22 1513 11/14/22 2230 11/15/22 0530  BP: 113/61 111/64 (!) 104/57 106/68  Pulse: 85 71 94 93  Resp: '18 17 16 16  '$ Temp: 99 F (37.2 C) 98.2 F (36.8 C) 98.3 F (36.8 C) 98.3 F (36.8 C)  TempSrc:  Oral Oral Oral  SpO2: 100% 100% 97% 96%  Weight:      Height:       General: alert, cooperative, and no distress Lochia: appropriate Uterine Fundus: firm Incision: Healing well with no significant drainage DVT Evaluation: No evidence of DVT seen on physical  exam. Labs: Lab Results  Component Value Date   WBC 12.7 (H) 11/13/2022   HGB 11.6 (L) 11/13/2022   HCT 33.9 (L) 11/13/2022   MCV 89.0 11/13/2022   PLT 177 11/13/2022      Latest Ref Rng & Units 11/12/2022    7:17 PM  CMP  Glucose 70 - 99 mg/dL 90   BUN 6 - 20 mg/dL 9   Creatinine 0.44 - 1.00 mg/dL 0.52   Sodium 135 - 145 mmol/L 134   Potassium 3.5 - 5.1 mmol/L 3.9   Chloride 98 - 111 mmol/L 105   CO2 22 - 32 mmol/L 22   Calcium 8.9 - 10.3 mg/dL 9.2   Total Protein 6.5 - 8.1 g/dL 6.2   Total Bilirubin 0.3 - 1.2 mg/dL 0.2   Alkaline Phos 38 - 126 U/L 183   AST 15 - 41 U/L 20   ALT 0 - 44 U/L 20    Edinburgh Score:    11/13/2022   11:00 PM  Edinburgh Postnatal Depression Scale Screening Tool  I have been able to laugh and see the funny side of things. 0  I have looked forward with enjoyment to things. 0  I have blamed myself unnecessarily when things went wrong. 1  I have been anxious or worried for no good reason. 2  I have felt scared or  panicky for no good reason. 1  Things have been getting on top of me. 1  I have been so unhappy that I have had difficulty sleeping. 2  I have felt sad or miserable. 0  I have been so unhappy that I have been crying. 0  The thought of harming myself has occurred to me. 0  Edinburgh Postnatal Depression Scale Total 7      After visit meds:     Discharge home in stable condition Infant Feeding: Breast Infant Disposition:home with mother Discharge instruction: per After Visit Summary and Postpartum booklet. Activity: Advance as tolerated. Pelvic rest for 6 weeks.  Diet: routine diet Anticipated Birth Control: Unsure Postpartum Appointment:6 weeks Additional Postpartum F/U:  Future Appointments:No future appointments. Follow up Visit:      11/15/2022 Allena Katz, MD

## 2022-11-23 ENCOUNTER — Telehealth (HOSPITAL_COMMUNITY): Payer: Self-pay | Admitting: *Deleted

## 2022-11-23 NOTE — Telephone Encounter (Signed)
Left phone voicemail message.  Odis Hollingshead, RN 11-23-2022 at 2:08pm

## 2022-12-24 DIAGNOSIS — Z1389 Encounter for screening for other disorder: Secondary | ICD-10-CM | POA: Diagnosis not present

## 2023-07-18 ENCOUNTER — Ambulatory Visit (HOSPITAL_BASED_OUTPATIENT_CLINIC_OR_DEPARTMENT_OTHER): Payer: BC Managed Care – PPO | Admitting: Family Medicine

## 2023-07-18 ENCOUNTER — Encounter (HOSPITAL_BASED_OUTPATIENT_CLINIC_OR_DEPARTMENT_OTHER): Payer: Self-pay | Admitting: Family Medicine

## 2023-07-18 VITALS — BP 114/69 | HR 81 | Ht 62.0 in | Wt 169.7 lb

## 2023-07-18 DIAGNOSIS — Z0000 Encounter for general adult medical examination without abnormal findings: Secondary | ICD-10-CM | POA: Diagnosis not present

## 2023-07-18 DIAGNOSIS — R140 Abdominal distension (gaseous): Secondary | ICD-10-CM | POA: Diagnosis not present

## 2023-07-18 NOTE — Patient Instructions (Signed)

## 2023-07-18 NOTE — Progress Notes (Signed)
Complete physical exam  Patient: Destiny Keith   DOB: [birth year]   37 y.o. Female  MRN: [id number]  Subjective:    Destiny Keith is a 37 y.o. female who presents today for a complete physical exam. She reports consuming a general diet. Home exercise routine includes walking 1 hrs per day. She tries to do at least 2 days per week. She generally feels well. She reports sleeping well. She does not have additional problems to discuss today.   GI specialist: [place], cannot remember name   Difficulty losing weight- tried to improve eating habits & exercising more Lots of food, makes her bloated Always have had constipation problems- now taking magnesium citrate & drinking more fluids  Denies N/V, diarrhea, blood in stool, melena. Abd bloating after she eats- bread, cheese, milk makes her bloated after she eats- this is new for her, since around 10/2022. Mid epigastric region is more tender LBM is about every 2-3 days, with significant straining- reports issues with constipation    HEALTH SCREENINGS: - Vision Screening: never been - Dental Visits: up to date - Pap smear: has scheduled for 08/2023 - Breast Exam: discussed  - STD Screening: Declined - Mammogram (40+): Not applicable  - Colonoscopy (45+): Not applicable  - Bone Density (65+ or under 65 with predisposing conditions): Not applicable  - Lung CA screening with low-dose CT:  Not applicable Adults age 37-80 who are current cigarette smokers or quit within the last 15 years. Must have 20 pack year history.   IMMUNIZATIONS: - Tdap: Tetanus vaccination status reviewed: last tetanus booster within 10 years. - HPV: Refused - Influenza: Refused - Pneumovax: Not applicable - Prevnar 20: Not applicable - Zostavax (50+): Not applicable   Depression screenings:    07/18/2023   10:33 AM 01/02/2022    3:12 PM 02/16/2021    2:50 PM  Depression screen PHQ 2/9  Decreased Interest 0 0 0  Down, Depressed, Hopeless 0 0 0   PHQ - 2 Score 0 0 0  Altered sleeping   1  Tired, decreased energy   1  Change in appetite   0  Feeling bad or failure about yourself    0  Trouble concentrating   0  Moving slowly or fidgety/restless   0  Suicidal thoughts   0  PHQ-9 Score   2  Difficult doing work/chores   Somewhat difficult    Anxiety screenings:    02/16/2021    2:50 PM  GAD 7 : Generalized Anxiety Score  Nervous, Anxious, on Edge 0  Control/stop worrying 0  Worry too much - different things 0  Trouble relaxing 0  Restless 0  Easily annoyed or irritable 0  Afraid - awful might happen 0  Total GAD 7 Score 0   Patient Care Team: Alyson Reedy, FNP as PCP - General (Family Medicine)   Outpatient Medications Prior to Visit  Medication Sig   acetaminophen (TYLENOL) 325 MG tablet Take 650 mg by mouth every 6 (six) hours as needed for mild pain.   ibuprofen (ADVIL) 600 MG tablet Take 1 tablet (600 mg total) by mouth every 6 (six) hours as needed.   [DISCONTINUED] oxyCODONE (OXY IR/ROXICODONE) 5 MG immediate release tablet Take 1 tablet (5 mg total) by mouth every 6 (six) hours as needed for moderate pain. (Patient not taking: Reported on 07/18/2023)   [DISCONTINUED] Prenatal Vit-Fe Fumarate-FA (MULTIVITAMIN-PRENATAL) 27-0.8 MG TABS tablet Take 1 tablet by mouth daily at 12 noon. (Patient not taking: Reported  on 07/18/2023)   No facility-administered medications prior to visit.    Review of Systems  Constitutional:  Negative for chills, fever, malaise/fatigue and weight loss.  HENT:  Negative for congestion and sinus pain.   Eyes:  Negative for blurred vision and double vision.  Respiratory:  Negative for cough and shortness of breath.   Cardiovascular:  Negative for chest pain, palpitations and leg swelling.  Gastrointestinal:  Positive for abdominal pain and constipation. Negative for blood in stool, diarrhea, heartburn, melena, nausea and vomiting.  Genitourinary:  Negative for frequency and urgency.   Musculoskeletal:  Negative for myalgias.  Neurological:  Negative for dizziness, weakness and headaches.  Psychiatric/Behavioral:  Negative for depression, substance abuse and suicidal ideas. The patient is not nervous/anxious and does not have insomnia.        Objective:     BP 114/69   Pulse 81   Ht 5\' 2"  (1.575 m)   Wt 169 lb 11.2 oz (77 kg)   LMP 07/11/2023 (Exact Date)   SpO2 99%   Breastfeeding No   BMI 31.04 kg/m  BP Readings from Last 3 Encounters:  07/18/23 114/69  11/15/22 106/68  07/30/22 123/62     Physical Exam Constitutional:      Appearance: Normal appearance.  HENT:     Head: Normocephalic.     Right Ear: Tympanic membrane, ear canal and external ear normal.     Left Ear: Tympanic membrane, ear canal and external ear normal.     Nose: Nose normal.     Mouth/Throat:     Mouth: Mucous membranes are moist.     Pharynx: Oropharynx is clear.  Eyes:     Extraocular Movements: Extraocular movements intact.     Pupils: Pupils are equal, round, and reactive to light.  Cardiovascular:     Rate and Rhythm: Normal rate and regular rhythm.     Pulses: Normal pulses.     Heart sounds: Normal heart sounds.  Pulmonary:     Effort: Pulmonary effort is normal.     Breath sounds: Normal breath sounds.  Abdominal:     General: Abdomen is flat. Bowel sounds are normal.     Palpations: Abdomen is soft.  Musculoskeletal:        General: Normal range of motion.     Cervical back: Normal range of motion and neck supple.  Skin:    General: Skin is warm and dry.  Neurological:     Mental Status: She is alert.  Psychiatric:        Mood and Affect: Mood normal.        Behavior: Behavior normal.        Thought Content: Thought content normal.        Judgment: Judgment normal.         Assessment & Plan:    Routine Health Maintenance and Physical Exam  Health Maintenance  Topic Date Due   Pap with HPV screening  Never done   COVID-19 Vaccine (3 - 2023-24  season) 08/03/2023*   Flu Shot  12/16/2023*   DTaP/Tdap/Td vaccine (2 - Td or Tdap) 12/29/2029   Hepatitis C Screening  Completed   HIV Screening  Completed   HPV Vaccine  Aged Out  *Topic was postponed. The date shown is not the original due date.    1. Wellness examination Routine HCM labs ordered. Will obtain non-fasting labs today and update patient with results.  Review of PMH, FH, SH, medications and HM performed. Preventative  care hand-out provided.  Recommend healthy diet.  Recommend approximately 150 minutes/week of moderate intensity exercise. Recommend regular dental and vision exams. Always use seatbelt/lap and shoulder restraints. Recommend using smoke alarms and checking batteries at least twice a year. Recommend using sunscreen when outside. Discussed immunization recommendations for influenza vaccine. Patient declines at this time, will consider.  - CBC with Differential/Platelet - Comprehensive metabolic panel - Hemoglobin A1c - Lipid panel - TSH Rfx on Abnormal to Free T4 - HCV Ab w Reflex to Quant PCR  2. Postprandial abdominal bloating Patient presents today with concerns regarding postprandial abdominal bloating. She reports she did notice this after her first pregnancy and has now noticed it with her second child. Discussed increasing water intake and FODMAPs diet- including the benefits of avoiding foods that naturally are more likely to cause bloating. Discussed using OTC stool softeners to help prevent constipation. Will obtain ESR and CRP for assessment of inflammation. If labs come back elevated, would be reasonable to refer to GI for further evaluation. If labs return normal, will advise patient to try diet for about 8 weeks and, if bloating is still occurring, GI referral would be reasonable.  - Sed Rate (ESR) - C-reactive protein   Return in about 1 year (around 07/17/2024) for Physical with fasting labs.     Alyson Reedy, FNP

## 2023-07-19 LAB — COMPREHENSIVE METABOLIC PANEL
ALT: 16 [IU]/L (ref 0–32)
AST: 14 [IU]/L (ref 0–40)
Albumin: 4.2 g/dL (ref 3.9–4.9)
Alkaline Phosphatase: 73 [IU]/L (ref 44–121)
BUN/Creatinine Ratio: 12 (ref 9–23)
BUN: 9 mg/dL (ref 6–20)
Bilirubin Total: <0.2 mg/dL (ref 0.0–1.2)
CO2: 25 mmol/L (ref 20–29)
Calcium: 9.3 mg/dL (ref 8.7–10.2)
Chloride: 103 mmol/L (ref 96–106)
Creatinine, Ser: 0.74 mg/dL (ref 0.57–1.00)
Globulin, Total: 2 g/dL (ref 1.5–4.5)
Glucose: 94 mg/dL (ref 70–99)
Potassium: 3.9 mmol/L (ref 3.5–5.2)
Sodium: 141 mmol/L (ref 134–144)
Total Protein: 6.2 g/dL (ref 6.0–8.5)
eGFR: 109 mL/min/{1.73_m2} (ref >59)

## 2023-07-19 LAB — CBC WITH DIFFERENTIAL/PLATELET
Basophils Absolute: 0 10*3/uL (ref 0.0–0.2)
Basos: 1 %
EOS (ABSOLUTE): 0.1 10*3/uL (ref 0.0–0.4)
Eos: 3 %
Hematocrit: 38 % (ref 34.0–46.6)
Hemoglobin: 12.6 g/dL (ref 11.1–15.9)
Immature Grans (Abs): 0 10*3/uL (ref 0.0–0.1)
Immature Granulocytes: 0 %
Lymphocytes Absolute: 1.6 10*3/uL (ref 0.7–3.1)
Lymphs: 32 %
MCH: 30.1 pg (ref 26.6–33.0)
MCHC: 33.2 g/dL (ref 31.5–35.7)
MCV: 91 fL (ref 79–97)
Monocytes Absolute: 0.3 10*3/uL (ref 0.1–0.9)
Monocytes: 5 %
Neutrophils Absolute: 3 10*3/uL (ref 1.4–7.0)
Neutrophils: 59 %
Platelets: 322 10*3/uL (ref 150–450)
RBC: 4.19 x10E6/uL (ref 3.77–5.28)
RDW: 12.4 % (ref 11.7–15.4)
WBC: 5 10*3/uL (ref 3.4–10.8)

## 2023-07-19 LAB — LIPID PANEL
Chol/HDL Ratio: 4.2 ratio (ref 0.0–4.4)
Cholesterol, Total: 199 mg/dL (ref 100–199)
HDL: 47 mg/dL (ref >39)
LDL Chol Calc (NIH): 122 mg/dL — ABNORMAL HIGH (ref 0–99)
Triglycerides: 167 mg/dL — ABNORMAL HIGH (ref 0–149)
VLDL Cholesterol Cal: 30 mg/dL (ref 5–40)

## 2023-07-19 LAB — HEMOGLOBIN A1C
Est. average glucose Bld gHb Est-mCnc: 111 mg/dL
Hgb A1c MFr Bld: 5.5 % (ref 4.8–5.6)

## 2023-07-19 LAB — HCV AB W REFLEX TO QUANT PCR: HCV Ab: NONREACTIVE

## 2023-07-19 LAB — HCV INTERPRETATION

## 2023-07-19 LAB — SEDIMENTATION RATE: Sed Rate: 3 mm/h (ref 0–32)

## 2023-07-19 LAB — TSH RFX ON ABNORMAL TO FREE T4: TSH: 1.01 u[IU]/mL (ref 0.450–4.500)

## 2023-07-19 LAB — C-REACTIVE PROTEIN: CRP: 3 mg/L (ref 0–10)

## 2023-07-24 DIAGNOSIS — N760 Acute vaginitis: Secondary | ICD-10-CM | POA: Diagnosis not present

## 2023-08-08 ENCOUNTER — Encounter (HOSPITAL_BASED_OUTPATIENT_CLINIC_OR_DEPARTMENT_OTHER): Payer: Self-pay | Admitting: Family Medicine

## 2023-08-08 ENCOUNTER — Ambulatory Visit (HOSPITAL_BASED_OUTPATIENT_CLINIC_OR_DEPARTMENT_OTHER): Payer: BC Managed Care – PPO | Admitting: Family Medicine

## 2023-08-08 VITALS — BP 122/70 | HR 92 | Temp 98.4°F | Ht 62.0 in | Wt 169.3 lb

## 2023-08-08 DIAGNOSIS — R0981 Nasal congestion: Secondary | ICD-10-CM | POA: Diagnosis not present

## 2023-08-08 DIAGNOSIS — R509 Fever, unspecified: Secondary | ICD-10-CM

## 2023-08-08 DIAGNOSIS — R52 Pain, unspecified: Secondary | ICD-10-CM | POA: Diagnosis not present

## 2023-08-08 LAB — POCT RAPID STREP A (OFFICE): Rapid Strep A Screen: NEGATIVE

## 2023-08-08 LAB — POCT INFLUENZA A/B
Influenza A, POC: NEGATIVE
Influenza B, POC: NEGATIVE

## 2023-08-08 LAB — POC COVID19 BINAXNOW: SARS Coronavirus 2 Ag: NEGATIVE

## 2023-08-08 MED ORDER — PROMETHAZINE-DM 6.25-15 MG/5ML PO SYRP: 5.0000 mL | ORAL_SOLUTION | Freq: Four times a day (QID) | ORAL | 0 refills | Status: DC | PRN

## 2023-08-08 NOTE — Progress Notes (Signed)
Acute Office Visit  Subjective:    Patient ID: Destiny Keith, female    DOB: Aug 04, 1986, 37 y.o.   MRN: [id number]  Chief Complaint  Patient presents with   Fever    102.5 fever, fevers only hit at night time   Sore Throat    Feels drainage in her throat that makes her cough, ongoing for about 2-3 days. Has been using dayquil and nyquil   Generalized Body Aches   Nasal Congestion   Destiny Keith is a 37 year old female patient who presents today for concerns about fever, sore throat, postnasal drip, non-productive cough, decreased appetite starting today, and nasal congestion. She reports she is starting to lose her voice. Denies chest pain, shob, wheezing, ear pain.   Review of Systems  Constitutional:  Positive for chills and fever.  HENT:  Positive for congestion and sore throat. Negative for ear pain.   Eyes:  Negative for pain and redness.  Respiratory:  Positive for cough and sputum production. Negative for shortness of breath and wheezing.   Cardiovascular:  Negative for palpitations and leg swelling.  Gastrointestinal:  Negative for abdominal pain, nausea and vomiting.  Genitourinary:  Negative for frequency and urgency.  Musculoskeletal:  Positive for myalgias.  Neurological:  Positive for headaches. Negative for dizziness.      Objective:    BP 122/70   Pulse 92   Temp 98.4 F (36.9 C) (Oral)   Ht 5\' 2"  (1.575 m)   Wt 169 lb 4.8 oz (76.8 kg)   LMP 07/11/2023 (Exact Date)   SpO2 100%   BMI 30.97 kg/m   Physical Exam Vitals reviewed.  Constitutional:      Appearance: Normal appearance. She is well-developed.  HENT:     Right Ear: Tympanic membrane, ear canal and external ear normal.     Left Ear: Tympanic membrane, ear canal and external ear normal.     Nose: Congestion present. No rhinorrhea.     Right Turbinates: Swollen. Not enlarged.     Left Turbinates: Swollen. Not enlarged.     Right Sinus: No maxillary sinus tenderness or frontal sinus  tenderness.     Left Sinus: No maxillary sinus tenderness or frontal sinus tenderness.     Mouth/Throat:     Mouth: Mucous membranes are moist.     Dentition: Normal dentition.     Pharynx: Posterior oropharyngeal erythema and postnasal drip present. No pharyngeal swelling, oropharyngeal exudate or uvula swelling.     Tonsils: No tonsillar exudate or tonsillar abscesses.  Pulmonary:     Effort: Pulmonary effort is normal.     Breath sounds: Normal breath sounds.  Neurological:     Mental Status: She is alert.    Assessment & Plan:   1. Body aches Patient is a pleasant 37 year old female patient who presents today for generalized viral symptoms- including fever, sore throat, postnasal drip, non-productive cough, decreased appetite starting today, and nasal congestion. She reports she is starting to lose her voice. Denies chest pain, shob, wheezing, ear pain. POCT COVID, rapid strep and influenza swabs negative. Patient is no acute distress and is well-appearing. Physical exam with lungs clear to auscultation bilaterally anteriorly and posteriorly. TM intact without erythema, bulging or effusion present. Cough is dry and infrequent. Nasal congestion and swollen nasal turbinates. Erythema present in posterior pharynx. Less likely to be pneumonia, bronchitis, or acute bacterial rhinosinusitis. Discussed with patient that this is most likely viral URI and should resolve in 7-10 days. Advised her to continue  with Tylenol and/or Advil, start using steroid nasal spray, and to start taking cough medication as prescribed. Counseled her about Afrin nasal spray and only using this medication for a minimum of 3 days. Call office if symptoms do not improve or resolve by middle of next week.  - POC COVID-19 BinaxNow - POCT rapid strep A - POCT Influenza A/B  2. Nasal congestion See #1 - POC COVID-19 BinaxNow - POCT rapid strep A - POCT Influenza A/B  3. Fever, unspecified fever cause See #1 - POC  COVID-19 BinaxNow - POCT rapid strep A - POCT Influenza A/B   Return if symptoms worsen or fail to improve.  Alyson Reedy, FNP

## 2023-08-08 NOTE — Patient Instructions (Addendum)
Patient drug information  Nasal oxymetazoline (Afrin)  Tell all of your health care providers that you take this drug. This includes your doctors, nurses, pharmacists, and dentists.  Do not use for more than 3 days. Using this drug too often or for longer than you have been told may cause nose stuffiness to happen again or get worse.  This drug may cause harm if swallowed or if too much is used. The chance is higher in children. If this drug is swallowed or too much is used, call a doctor or poison control center right away.  Talk with the doctor before you give this drug to a child younger than 98 years old.  Tell your doctor if you are pregnant, plan on getting pregnant, or are breast-feeding. You will need to talk about the benefits and risks to you and the baby.  Nasal spray (fluticasone, aka Flonase)  Intranasal: 1 or 2 sprays in each nostril twice daily; maximum dose: 2 sprays in each nostril twice daily for inflammatory relief.   Fever/Pain Continue to take Advil and/or Tylenol for fever and pain relief.  Make sure you are staying hydrated. Warm fluids and honey may also help with your cough.

## 2023-08-10 ENCOUNTER — Encounter (HOSPITAL_BASED_OUTPATIENT_CLINIC_OR_DEPARTMENT_OTHER): Payer: Self-pay | Admitting: Family Medicine

## 2023-08-12 ENCOUNTER — Other Ambulatory Visit (HOSPITAL_BASED_OUTPATIENT_CLINIC_OR_DEPARTMENT_OTHER): Payer: Self-pay | Admitting: Family Medicine

## 2023-08-12 DIAGNOSIS — J0140 Acute pansinusitis, unspecified: Secondary | ICD-10-CM | POA: Diagnosis not present

## 2023-08-12 DIAGNOSIS — H9201 Otalgia, right ear: Secondary | ICD-10-CM | POA: Diagnosis not present

## 2023-08-12 DIAGNOSIS — B9689 Other specified bacterial agents as the cause of diseases classified elsewhere: Secondary | ICD-10-CM

## 2023-08-12 MED ORDER — AMOXICILLIN-POT CLAVULANATE 875-125 MG PO TABS: 1.0000 | ORAL_TABLET | Freq: Two times a day (BID) | ORAL | 0 refills | Status: AC

## 2023-09-02 DIAGNOSIS — Z124 Encounter for screening for malignant neoplasm of cervix: Secondary | ICD-10-CM | POA: Diagnosis not present

## 2023-09-02 DIAGNOSIS — Z6831 Body mass index (BMI) 31.0-31.9, adult: Secondary | ICD-10-CM | POA: Diagnosis not present

## 2023-09-02 DIAGNOSIS — Z130 Encounter for screening for diseases of the blood and blood-forming organs and certain disorders involving the immune mechanism: Secondary | ICD-10-CM | POA: Diagnosis not present

## 2023-09-02 DIAGNOSIS — Z01419 Encounter for gynecological examination (general) (routine) without abnormal findings: Secondary | ICD-10-CM | POA: Diagnosis not present

## 2023-09-03 LAB — HM PAP SMEAR

## 2023-11-07 ENCOUNTER — Ambulatory Visit: Payer: Self-pay | Admitting: Family Medicine

## 2023-11-07 NOTE — Telephone Encounter (Signed)
Patient has a scheduled appt with Jon Gills 2/21. Closing encounter.

## 2023-11-07 NOTE — Telephone Encounter (Signed)
Copied from CRM [id number]. Topic: Clinical - Red Word Triage >> Nov 07, 2023  4:12 PM Elle L wrote: Red Word that prompted transfer to Nurse Triage: The patient's sons have the flu A. She states that she has a fever of 101, cough, congestion, but she is having severe body aches and pains.   Chief Complaint: Flu Symptoms: Fever, cough, body aches, congestion  Frequency: Constant  Pertinent Negatives: Patient denies difficulty breathing  Disposition: [] ED /[] Urgent Care (no appt availability in office) / [x] Appointment(In office/virtual)/ []  [place] [facility]/ [] Home Care/ [] Refused Recommended Disposition /[] [place] [facility]/ []  Follow-up with PCP Additional Notes: Patient reports her sons were diagnosed with Flu A and yesterday she began to experience symptoms. Patient is experiencing fever, body aches, congestion, and cough. She states she has taken Tylenol and Nyquil with some relief. Patient requesting Tamiflu. Appointment made for tomorrow for the patient.    Reason for Disposition  [1] Patient is NOT HIGH RISK AND [2] strongly requests antiviral medicine AND [3] flu symptoms present < 48 hours  Answer Assessment - Initial Assessment Questions 1. WORST SYMPTOM: "What is your worst symptom?" (e.g., cough, runny nose, muscle aches, headache, sore throat, fever)      Body aches 2. ONSET: "When did your flu symptoms start?"      Yesterday  3. COUGH: "How bad is the cough?"       Frequent  4. RESPIRATORY DISTRESS: "Describe your breathing."      No 5. FEVER: "Do you have a fever?" If Yes, ask: "What is your temperature, how was it measured, and when did it start?"     101 6. EXPOSURE: "Were you exposed to someone with influenza?"       Yes, sons have Flu A 7. FLU VACCINE: "Did you get a flu shot this year?"     No 8. HIGH RISK DISEASE: "Do you have any chronic medical problems?" (e.g., heart or lung disease, asthma, weak immune system, or other HIGH RISK conditions)      No 9. PREGNANCY: "Is there any chance you are pregnant?" "When was your last menstrual period?"     No 10. OTHER SYMPTOMS: "Do you have any other symptoms?"  (e.g., runny nose, muscle aches, headache, sore throat)       Sinus pressure, headache, cough congestion, sore throat  Protocols used: Influenza (Flu) - [facility]

## 2023-11-08 ENCOUNTER — Ambulatory Visit (HOSPITAL_BASED_OUTPATIENT_CLINIC_OR_DEPARTMENT_OTHER): Payer: BC Managed Care – PPO | Admitting: Family Medicine

## 2023-11-08 ENCOUNTER — Other Ambulatory Visit (HOSPITAL_BASED_OUTPATIENT_CLINIC_OR_DEPARTMENT_OTHER): Payer: Self-pay

## 2023-11-08 ENCOUNTER — Encounter (HOSPITAL_BASED_OUTPATIENT_CLINIC_OR_DEPARTMENT_OTHER): Payer: Self-pay | Admitting: Family Medicine

## 2023-11-08 ENCOUNTER — Encounter (HOSPITAL_BASED_OUTPATIENT_CLINIC_OR_DEPARTMENT_OTHER): Payer: Self-pay | Admitting: *Deleted

## 2023-11-08 VITALS — BP 120/72 | HR 96 | Temp 98.4°F | Ht 62.0 in | Wt 169.9 lb

## 2023-11-08 DIAGNOSIS — Z8669 Personal history of other diseases of the nervous system and sense organs: Secondary | ICD-10-CM | POA: Insufficient documentation

## 2023-11-08 DIAGNOSIS — J101 Influenza due to other identified influenza virus with other respiratory manifestations: Secondary | ICD-10-CM | POA: Diagnosis not present

## 2023-11-08 LAB — POCT INFLUENZA A/B
Influenza A, POC: POSITIVE — AB
Influenza B, POC: NEGATIVE

## 2023-11-08 LAB — POC COVID19 BINAXNOW: SARS Coronavirus 2 Ag: NEGATIVE

## 2023-11-08 MED ORDER — OSELTAMIVIR PHOSPHATE 75 MG PO CAPS
75.0000 mg | ORAL_CAPSULE | Freq: Two times a day (BID) | ORAL | 0 refills | Status: AC
  Filled 2023-11-08: qty 10, 5d supply, fill #0

## 2023-11-08 NOTE — Progress Notes (Signed)
Acute Care Office Visit  Subjective:   Destiny Keith 1985-09-22 11/08/2023  Chief Complaint  Patient presents with   Cough    Patient has been having symptoms including congestion and a cough which began two days ago but states she now has been having fever (temp of 101.7 at 4am) and body aches. States both of her sons have the flu.    HPI: Patient reports new onset of symptoms x 2 days of congestion, sinus pressure, cough, body aches and fever.  She states both of her sons were diagnosed with flu.  She has been using Tylenol, DayQuil and NyQuil for relief of symptoms.  She denies nausea, vomiting, diarrhea, shortness of breath.  Reports ongoing nasal congestion, drainage, sore throat, and postnasal drip.  He has not taken any home test to test for COVID or flu.  Reports last fever of 101.7 at 4 AM and has been using Tylenol to control.   The following portions of the patient's history were reviewed and updated as appropriate: past medical history, past surgical history, family history, social history, allergies, medications, and problem list.   Patient Active Problem List   Diagnosis Date Noted   History of migraine 11/08/2023   Hyperemesis affecting pregnancy, antepartum 04/26/2022   Acute non-recurrent pansinusitis 11/09/2021   Uterine leiomyoma 08/23/2021   Abnormal uterine bleeding 03/08/2021   Hemorrhoid 02/16/2021   Chronic idiopathic constipation 02/16/2021   Body mass index 28.0-28.9, adult 02/16/2021   ANA positive 02/16/2021   Genital warts 07/29/2019   Migraine 09/04/2017   Past Medical History:  Diagnosis Date   Fibroid    Genital warts    Headache    Ovarian cyst    UTI (urinary tract infection)    Past Surgical History:  Procedure Laterality Date   CESAREAN SECTION N/A 11/12/2022   Procedure: CESAREAN SECTION;  Surgeon: Lyn Henri, MD;  Location: [facility];  Service: Obstetrics;  Laterality: N/A;   NO PAST SURGERIES     Family History   Problem Relation Age of Onset   Healthy Mother    Healthy Father    Diabetes Maternal Grandmother    Hypertension Maternal Grandmother    Thyroid disease Maternal Grandmother    Outpatient Medications Prior to Visit  Medication Sig Dispense Refill   acetaminophen (TYLENOL) 325 MG tablet Take 650 mg by mouth every 6 (six) hours as needed for mild pain.     ibuprofen (ADVIL) 600 MG tablet Take 1 tablet (600 mg total) by mouth every 6 (six) hours as needed. 30 tablet 0   Pseudoeph-Doxylamine-DM-APAP (NYQUIL PO) Take by mouth.     Pseudoephedrine-APAP-DM (DAYQUIL PO) Take by mouth.     promethazine-dextromethorphan (PROMETHAZINE-DM) 6.25-15 MG/5ML syrup Take 5 mLs by mouth 4 (four) times daily as needed for cough (Maximum dose: 30mL in 24 hours). 118 mL 0   No facility-administered medications prior to visit.   No Known Allergies   ROS: A complete ROS was performed with pertinent positives/negatives noted in the HPI. The remainder of the ROS are negative.    Objective:   Today's Vitals   11/08/23 1054  BP: 120/72  Pulse: 96  Temp: 98.4 F (36.9 C)  TempSrc: Oral  SpO2: 100%  Weight: 169 lb 14.4 oz (77.1 kg)  Height: 5\' 2"  (1.575 m)    GENERAL: Ill appearing, in NAD. Well nourished.  SKIN: Pink, warm and dry. No rash, lesion, ulceration, or ecchymoses.  Head: Normocephalic. NECK: Trachea midline. Full  ROM w/o pain or tenderness. No lymphadenopathy.  EARS: Tympanic membranes are intact, translucent without bulging and without drainage. Appropriate landmarks visualized.  EYES: Conjunctiva clear without exudates. EOMI, PERRL, no drainage present.  NOSE: Septum midline w/o deformity. Nares patent, mucosa pink and elderly inflamed w/ clear drainage.  Moderate sinus tenderness.  THROAT: Uvula midline. Oropharynx clear.  Mucous membranes pink and moist.  RESPIRATORY: Chest wall symmetrical. Respirations even and non-labored. Breath sounds clear to auscultation bilaterally.  Cough is  congested, nonproductive CARDIAC: S1, S2 present, regular rate and rhythm without murmur or gallops. Peripheral pulses 2+ bilaterally.  MSK: Muscle tone and strength appropriate for age.  NEUROLOGIC: No motor or sensory deficits. Steady, even gait. C2-C12 intact.  PSYCH/MENTAL STATUS: Alert, oriented x 3. Cooperative, appropriate mood and affect.    Results for orders placed or performed in visit on 11/08/23  POCT Influenza A/B  Result Value Ref Range   Influenza A, POC Positive (A) Negative   Influenza B, POC Negative Negative  POC COVID-19 BinaxNow  Result Value Ref Range   SARS Coronavirus 2 Ag Negative Negative      Assessment & Plan:  1. Influenza A (Primary) Positive for flu a in office.  Will start Tamiflu 1 capsule twice a day for 5 days.  Discussed symptom management with vitamin C, D, and zinc.  She can use Tylenol and ibuprofen alternated every 4-6 hours for fever, chills, and bodyaches.  Recommend antihistamine and/or use of Flonase for nasal congestion.  Discussed red flag signs and symptoms to seek emergency services for and patient verbalized understanding.  - POCT Influenza A/B - POC COVID-19 BinaxNow - oseltamivir (TAMIFLU) 75 MG capsule; Take 1 capsule (75 mg total) by mouth 2 (two) times daily for 5 days.  Dispense: 10 capsule; Refill: 0   Meds ordered this encounter  Medications   oseltamivir (TAMIFLU) 75 MG capsule    Sig: Take 1 capsule (75 mg total) by mouth 2 (two) times daily for 5 days.    Dispense:  10 capsule    Refill:  0    Supervising Provider:   DE Peru, RAYMOND J [[id number]]   Lab Orders         POCT Influenza A/B         POC COVID-19 BinaxNow      Return if symptoms worsen or fail to improve.    Patient to reach out to office if new, worrisome, or unresolved symptoms arise or if no improvement in patient's condition. Patient verbalized understanding and is agreeable to treatment plan. All questions answered to patient's satisfaction.     Hilbert Bible, [place]

## 2023-11-08 NOTE — Patient Instructions (Signed)
Stay home and away from others until symptoms are improving and fever free for 24 hours without the use of tylenol or ibuprofen.If symptoms worsen, or the patient develop shortness of breath, pulse oximeter reading of < 90%, or chest pain, seek immediate care at nearest emergency department or call 911.   Recommended OTC vitamins, medications for control of symptoms.    Vitamin Regimen:  Vitamin C 500mg  twice daily  Vitamin D 5000 units once daily  Zinc 50-75mg  once daily   Over the counter Medications:  Aspirin 81mg  per day * unless allergic or contraindicated*  Use Tylenol (acetaminophen) for fever *unless allergic or contraindicated*    Non-Medication Therapy:  Drink plenty of fluids, warm if possible.   A teaspoon of honey may help ease coughing symptoms.   Cough drops or hard candy for coughing.   Over the Counter Medication Therapy:  Use a cough expectorant such as guaifenesin (Mucinex) if recommended by your doctor for a wet, congested cough. If you have high blood pressure, please ask your doctor first before using this.   Use a cough suppressant such as dextromethorphan (Robitussin/Delsym) for a dry cough. If you have high blood pressure, please ask your doctor first before using this.   If you have high blood pressure, medication such as Coricidin HBP is safe to take for your cough and will not increase your blood pressure.

## 2023-11-11 ENCOUNTER — Encounter: Payer: Self-pay | Admitting: Obstetrics and Gynecology

## 2023-11-18 ENCOUNTER — Other Ambulatory Visit (HOSPITAL_BASED_OUTPATIENT_CLINIC_OR_DEPARTMENT_OTHER): Payer: Self-pay

## 2023-11-18 ENCOUNTER — Ambulatory Visit (HOSPITAL_BASED_OUTPATIENT_CLINIC_OR_DEPARTMENT_OTHER): Admitting: Family Medicine

## 2023-11-18 ENCOUNTER — Encounter (HOSPITAL_BASED_OUTPATIENT_CLINIC_OR_DEPARTMENT_OTHER): Payer: Self-pay | Admitting: Family Medicine

## 2023-11-18 VITALS — BP 113/71 | HR 75 | Temp 98.2°F | Ht 62.0 in | Wt 168.3 lb

## 2023-11-18 DIAGNOSIS — J069 Acute upper respiratory infection, unspecified: Secondary | ICD-10-CM

## 2023-11-18 MED ORDER — PROMETHAZINE-DM 6.25-15 MG/5ML PO SYRP
5.0000 mL | ORAL_SOLUTION | Freq: Four times a day (QID) | ORAL | 0 refills | Status: DC | PRN
  Filled 2023-11-18: qty 118, 6d supply, fill #0

## 2023-11-18 MED ORDER — PREDNISONE 10 MG (21) PO TBPK
ORAL_TABLET | ORAL | 0 refills | Status: DC
  Filled 2023-11-18: qty 21, 6d supply, fill #0

## 2023-11-18 NOTE — Patient Instructions (Signed)
 Start using plain Mucinex daily and increase your clear fluids for the next week.   You can use an anithistamine as needed such as Claritin or Zyrtec.

## 2023-11-18 NOTE — Progress Notes (Signed)
 Acute Care Office Visit  Subjective:   Destiny Keith 1986-08-25 11/18/2023  Chief Complaint  Patient presents with   Cough    Pt states since being diagnosed with the flu, she has been having a cough as well as chest discomfort. States the symptoms are worse at night. Will have phlegm production but is not able to get it up.    HPI: URI SYMPTOMS:  Patient reports recurring congested and productive cough with green thick sputum ongoing for 2 weeks.Patient was diagnosed with Flu A on 11/08/2023 and complete course of Tamiflu.  She states the cough worsens at night. She is still feeling intermittent fatigue, headaches. She is using Robitussin as needed.   Denies fever, vomiting, diarrhea. Reports mild nausea and nasal congestion. She reports decreased appetite due to congestion and chronic cough.. Denies tobacco use, denies asthma history. She denies Shortness of breath, chest pain, or palpitations.   The following portions of the patient's history were reviewed and updated as appropriate: past medical history, past surgical history, family history, social history, allergies, medications, and problem list.   Patient Active Problem List   Diagnosis Date Noted   History of migraine 11/08/2023   Hyperemesis affecting pregnancy, antepartum 04/26/2022   Acute non-recurrent pansinusitis 11/09/2021   Uterine leiomyoma 08/23/2021   Abnormal uterine bleeding 03/08/2021   Hemorrhoid 02/16/2021   Chronic idiopathic constipation 02/16/2021   Body mass index 28.0-28.9, adult 02/16/2021   ANA positive 02/16/2021   Genital warts 07/29/2019   Migraine 09/04/2017   Past Medical History:  Diagnosis Date   Fibroid    Genital warts    Headache    Ovarian cyst    UTI (urinary tract infection)    Past Surgical History:  Procedure Laterality Date   CESAREAN SECTION N/A 11/12/2022   Procedure: CESAREAN SECTION;  Surgeon: Lyn Henri, MD;  Location: [facility];  Service:  Obstetrics;  Laterality: N/A;   NO PAST SURGERIES     Family History  Problem Relation Age of Onset   Healthy Mother    Healthy Father    Diabetes Maternal Grandmother    Hypertension Maternal Grandmother    Thyroid disease Maternal Grandmother    Outpatient Medications Prior to Visit  Medication Sig Dispense Refill   acetaminophen (TYLENOL) 325 MG tablet Take 650 mg by mouth every 6 (six) hours as needed for mild pain.     ibuprofen (ADVIL) 600 MG tablet Take 1 tablet (600 mg total) by mouth every 6 (six) hours as needed. 30 tablet 0   Pseudoephedrine-DM-GG (ROBITUSSIN COLD & COUGH PO) Take by mouth.     Pseudoeph-Doxylamine-DM-APAP (NYQUIL PO) Take by mouth.     Pseudoephedrine-APAP-DM (DAYQUIL PO) Take by mouth.     No facility-administered medications prior to visit.   No Known Allergies   ROS: A complete ROS was performed with pertinent positives/negatives noted in the HPI. The remainder of the ROS are negative.    Objective:   Today's Vitals   11/18/23 1359  BP: 113/71  Pulse: 75  Temp: 98.2 F (36.8 C)  TempSrc: Oral  SpO2: 98%  Weight: 168 lb 4.8 oz (76.3 kg)  Height: 5\' 2"  (1.575 m)    GENERAL: Well-appearing, in NAD. Well nourished.  SKIN: Pink, warm and dry. Head: Normocephalic. NECK: Trachea midline. Full ROM w/o pain or tenderness. No lymphadenopathy.  EARS: Tympanic membranes are intact, translucent without bulging and without drainage. Appropriate landmarks visualized.  EYES: Conjunctiva clear without exudates. EOMI,  PERRL, no drainage present.  NOSE: Septum midline w/o deformity. Nares patent, mucosa pink and mildly inflamed w/ cloudy drainage. No sinus tenderness.  THROAT: Uvula midline. Oropharynx clear.  Mucous membranes pink and moist.  RESPIRATORY: Chest wall symmetrical. Respirations even and non-labored. Breath sounds clear to auscultation bilaterally. Cough congested, non productive.  CARDIAC: S1, S2 present, regular rate and rhythm without  murmur or gallops. Peripheral pulses 2+ bilaterally.  MSK: Muscle tone and strength appropriate for age. EXTREMITIES: Without clubbing, cyanosis, or edema.  NEUROLOGIC: No motor or sensory deficits. Steady, even gait. C2-C12 intact.  PSYCH/MENTAL STATUS: Alert, oriented x 3. Cooperative, appropriate mood and affect.    No results found for any visits on 11/18/23.    Assessment & Plan:  1. Acute URI (Primary) Possible residual congestion from influenza infection versus possible bronchitis. Recommend starting prednisone taper, Mucinex for congestion and use Promethazine DM cough syrup for nighttime cough. If no improvement in 48 hours or worsening, reach out to PCP.  - predniSONE (STERAPRED UNI-PAK 21 TAB) 10 MG (21) TBPK tablet; Use as directed.  Dispense: 21 each; Refill: 0 - promethazine-dextromethorphan (PROMETHAZINE-DM) 6.25-15 MG/5ML syrup; Take 5 mLs by mouth 4 (four) times daily as needed.  Dispense: 118 mL; Refill: 0   Meds ordered this encounter  Medications   predniSONE (STERAPRED UNI-PAK 21 TAB) 10 MG (21) TBPK tablet    Sig: Use as directed.    Dispense:  21 each    Refill:  0    Supervising Provider:   DE Peru, RAYMOND J [[id number]]   promethazine-dextromethorphan (PROMETHAZINE-DM) 6.25-15 MG/5ML syrup    Sig: Take 5 mLs by mouth 4 (four) times daily as needed.    Dispense:  118 mL    Refill:  0    Supervising Provider:   DE Peru, RAYMOND J [[id number]]   Lab Orders  No laboratory test(s) ordered today    Return if symptoms worsen or fail to improve.    Patient to reach out to office if new, worrisome, or unresolved symptoms arise or if no improvement in patient's condition. Patient verbalized understanding and is agreeable to treatment plan. All questions answered to patient's satisfaction.    Hilbert Bible, [place]

## 2024-01-24 DIAGNOSIS — N924 Excessive bleeding in the premenopausal period: Secondary | ICD-10-CM | POA: Diagnosis not present

## 2024-01-29 DIAGNOSIS — N920 Excessive and frequent menstruation with regular cycle: Secondary | ICD-10-CM | POA: Diagnosis not present

## 2024-02-07 DIAGNOSIS — N809 Endometriosis, unspecified: Secondary | ICD-10-CM | POA: Diagnosis not present

## 2024-02-07 DIAGNOSIS — N8000 Endometriosis of the uterus, unspecified: Secondary | ICD-10-CM | POA: Diagnosis not present

## 2024-02-07 DIAGNOSIS — Z98891 History of uterine scar from previous surgery: Secondary | ICD-10-CM | POA: Diagnosis not present

## 2024-02-07 DIAGNOSIS — N946 Dysmenorrhea, unspecified: Secondary | ICD-10-CM | POA: Diagnosis not present

## 2024-02-07 DIAGNOSIS — N920 Excessive and frequent menstruation with regular cycle: Secondary | ICD-10-CM | POA: Diagnosis not present

## 2024-02-24 ENCOUNTER — Encounter: Payer: Self-pay | Admitting: Family Medicine

## 2024-02-24 ENCOUNTER — Encounter (HOSPITAL_BASED_OUTPATIENT_CLINIC_OR_DEPARTMENT_OTHER): Payer: Self-pay | Admitting: Family Medicine

## 2024-02-24 NOTE — Telephone Encounter (Signed)
 Please see mychart message sent by pt and advise.

## 2024-02-25 ENCOUNTER — Ambulatory Visit (HOSPITAL_BASED_OUTPATIENT_CLINIC_OR_DEPARTMENT_OTHER): Admitting: Family Medicine

## 2024-02-25 ENCOUNTER — Other Ambulatory Visit: Payer: Self-pay | Admitting: Family Medicine

## 2024-04-01 DIAGNOSIS — N760 Acute vaginitis: Secondary | ICD-10-CM | POA: Diagnosis not present

## 2024-08-19 ENCOUNTER — Ambulatory Visit (HOSPITAL_BASED_OUTPATIENT_CLINIC_OR_DEPARTMENT_OTHER): Admitting: Family Medicine

## 2024-08-19 ENCOUNTER — Other Ambulatory Visit (HOSPITAL_BASED_OUTPATIENT_CLINIC_OR_DEPARTMENT_OTHER): Payer: Self-pay

## 2024-08-19 ENCOUNTER — Encounter (HOSPITAL_BASED_OUTPATIENT_CLINIC_OR_DEPARTMENT_OTHER): Payer: Self-pay | Admitting: Family Medicine

## 2024-08-19 VITALS — BP 110/63 | HR 72 | Ht 62.0 in | Wt 149.2 lb

## 2024-08-19 DIAGNOSIS — K649 Unspecified hemorrhoids: Secondary | ICD-10-CM

## 2024-08-19 DIAGNOSIS — Z13220 Encounter for screening for lipoid disorders: Secondary | ICD-10-CM | POA: Diagnosis not present

## 2024-08-19 MED ORDER — HYDROCORTISONE (PERIANAL) 2.5 % EX CREA
1.0000 | TOPICAL_CREAM | Freq: Two times a day (BID) | CUTANEOUS | 1 refills | Status: AC
  Filled 2024-08-19: qty 30, 30d supply, fill #0

## 2024-08-19 MED ORDER — HYDROCORTISONE ACETATE 25 MG RE SUPP
25.0000 mg | Freq: Two times a day (BID) | RECTAL | 0 refills | Status: AC
  Filled 2024-08-19: qty 12, 6d supply, fill #0

## 2024-08-19 NOTE — Progress Notes (Signed)
 Subjective:   Destiny Keith 10-05-1985 08/19/2024  Chief Complaint  Patient presents with   Medical Management of Chronic Issues    Pt is here today requesting to have a full checkup with bloodwork performed. Has external hemorrhoids that she wants to have looked at to see if a referral needs to be made to have them removed.     HPI: Destiny Keith presents today for re-assessment and management of chronic medical conditions.   EXTERNAL HEMORRHOIDS:  Patient states she has recurrent external hemorrhoids that do not relieve with preparation H, tucks pads. She is desiring surgical relief. She states current hemorrhoid has become more recurrent due to constipation. She has increased fiber, water  intake and states these worsened after pregnancies. She is desiring surgical intervention due to recurring hemorrhoids.    LAB REQUEST:  Patient requesting labs for an annual physical. She states she desired to schedule AE but current PCP is booked out to March 2026.   The following portions of the patient's history were reviewed and updated as appropriate: past medical history, past surgical history, family history, social history, allergies, medications, and problem list.   Patient Active Problem List   Diagnosis Date Noted   History of migraine 11/08/2023   Hyperemesis affecting pregnancy, antepartum 04/26/2022   Acute non-recurrent pansinusitis 11/09/2021   Uterine leiomyoma 08/23/2021   Abnormal uterine bleeding 03/08/2021   Hemorrhoid 02/16/2021   Chronic idiopathic constipation 02/16/2021   Body mass index 28.0-28.9, adult 02/16/2021   ANA positive 02/16/2021   Genital warts 07/29/2019   Migraine 09/04/2017   Past Medical History:  Diagnosis Date   Fibroid    Genital warts    Headache    Ovarian cyst    UTI (urinary tract infection)    Past Surgical History:  Procedure Laterality Date   CESAREAN SECTION N/A 11/12/2022   Procedure: CESAREAN SECTION;  Surgeon:  Lequita Evalene LABOR, MD;  Location: [facility];  Service: Obstetrics;  Laterality: N/A;   NO PAST SURGERIES     Family History  Problem Relation Age of Onset   Healthy Mother    Healthy Father    Diabetes Maternal Grandmother    Hypertension Maternal Grandmother    Thyroid disease Maternal Grandmother    Outpatient Medications Prior to Visit  Medication Sig Dispense Refill   aspirin-acetaminophen -caffeine  (EXCEDRIN MIGRAINE) 250-250-65 MG tablet Take by mouth every 6 (six) hours as needed for headache or migraine.     acetaminophen  (TYLENOL ) 325 MG tablet Take 650 mg by mouth every 6 (six) hours as needed for mild pain.     ibuprofen  (ADVIL ) 600 MG tablet Take 1 tablet (600 mg total) by mouth every 6 (six) hours as needed. 30 tablet 0   predniSONE  (STERAPRED UNI-PAK 21 TAB) 10 MG (21) TBPK tablet Use as directed. 21 each 0   promethazine -dextromethorphan (PROMETHAZINE -DM) 6.25-15 MG/5ML syrup Take 5 mLs by mouth 4 (four) times daily as needed. 118 mL 0   No facility-administered medications prior to visit.   No Known Allergies   ROS: A complete ROS was performed with pertinent positives/negatives noted in the HPI. The remainder of the ROS are negative.    Objective:   Today's Vitals   08/19/24 1012  BP: 110/63  Pulse: 72  SpO2: 100%  Weight: 149 lb 3.2 oz (67.7 kg)  Height: 5' 2 (1.575 m)    Physical Exam   GENERAL: Well-appearing, in NAD. Well nourished.  SKIN: Pink, warm and dry. No rash, lesion,  ulceration, or ecchymoses.  Head: Normocephalic. NECK: Trachea midline. Full ROM w/o pain or tenderness.  CARDIAC: S1, S2 present, regular rate and rhythm without murmur or gallops. Peripheral pulses 2+ bilaterally.  GU: Patient declined exam.  MSK: Muscle tone and strength appropriate for age.  NEUROLOGIC: No motor or sensory deficits. Steady, even gait. C2-C12 intact.  PSYCH/MENTAL STATUS: Alert, oriented x 3. Cooperative, appropriate mood and affect.       Assessment &  Plan:  1. Screening for lipid disorders (Primary) Discussed recommendation of getting labs with her physical due to cost and patient was agreeable. She would like a recheck of her lipid panel today and will obtain with fasting lab today.  - Lipid panel  2. Hemorrhoids, unspecified hemorrhoid type PCP recommended exam, patent declined. Would like referral to General Surgery , referral placed. Will refill Anusol suppositories and cream to use as needed.  - Ambulatory referral to General Surgery - hydrocortisone (ANUSOL-HC) 25 MG suppository; Place 1 suppository (25 mg total) rectally 2 (two) times daily.  Dispense: 12 suppository; Refill: 0 - hydrocortisone (ANUSOL-HC) 2.5 % rectal cream; Place 1 Application rectally 2 (two) times daily.  Dispense: 30 g; Refill: 1   Meds ordered this encounter  Medications   hydrocortisone (ANUSOL-HC) 25 MG suppository    Sig: Place 1 suppository (25 mg total) rectally 2 (two) times daily.    Dispense:  12 suppository    Refill:  0    Supervising Provider:   DE CUBA, RAYMOND J [[id number]]   hydrocortisone (ANUSOL-HC) 2.5 % rectal cream    Sig: Place 1 Application rectally 2 (two) times daily.    Dispense:  30 g    Refill:  1    Supervising Provider:   DE CUBA, RAYMOND J [[id number]]   Lab Orders         Lipid panel     Return in about 3 months (around 11/17/2024) for ANNUAL PHYSICAL.    Patient to reach out to office if new, worrisome, or unresolved symptoms arise or if no improvement in patient's condition. Patient verbalized understanding and is agreeable to treatment plan. All questions answered to patient's satisfaction.    Destiny Keith, [place]

## 2024-08-20 ENCOUNTER — Ambulatory Visit (HOSPITAL_BASED_OUTPATIENT_CLINIC_OR_DEPARTMENT_OTHER): Payer: Self-pay | Admitting: Family Medicine

## 2024-08-20 LAB — LIPID PANEL
Chol/HDL Ratio: 4.5 ratio — ABNORMAL HIGH (ref 0.0–4.4)
Cholesterol, Total: 189 mg/dL (ref 100–199)
HDL: 42 mg/dL (ref >39)
LDL Chol Calc (NIH): 135 mg/dL — ABNORMAL HIGH (ref 0–99)
Triglycerides: 62 mg/dL (ref 0–149)
VLDL Cholesterol Cal: 12 mg/dL (ref 5–40)

## 2024-08-20 NOTE — Progress Notes (Signed)
 Hi Destiny Keith, Your triglycerides and total cholesterol have improved from last year.  Your LDL is still slightly elevated above goal (130).  Please continue good heart healthy diet and regular exercise to help improve this.  Taking a supplement such as omega-3 fish oil.  May also be helpful.

## 2024-08-25 DIAGNOSIS — N771 Vaginitis, vulvitis and vulvovaginitis in diseases classified elsewhere: Secondary | ICD-10-CM | POA: Diagnosis not present

## 2024-08-25 DIAGNOSIS — N760 Acute vaginitis: Secondary | ICD-10-CM | POA: Diagnosis not present

## 2024-10-09 ENCOUNTER — Encounter (HOSPITAL_BASED_OUTPATIENT_CLINIC_OR_DEPARTMENT_OTHER): Payer: Self-pay | Admitting: Family Medicine

## 2024-10-12 NOTE — Telephone Encounter (Signed)
 Please see mychart message sent by pt and advise.

## 2024-10-13 ENCOUNTER — Other Ambulatory Visit (HOSPITAL_BASED_OUTPATIENT_CLINIC_OR_DEPARTMENT_OTHER): Payer: Self-pay | Admitting: Family Medicine

## 2024-10-13 DIAGNOSIS — K649 Unspecified hemorrhoids: Secondary | ICD-10-CM

## 2024-11-27 ENCOUNTER — Encounter (HOSPITAL_BASED_OUTPATIENT_CLINIC_OR_DEPARTMENT_OTHER): Admitting: Family Medicine
